# Patient Record
Sex: Male | Born: 1970 | Race: White | Hispanic: No | Marital: Single | Smoking: Former smoker
Health system: Southern US, Community
[De-identification: ages and names within clinical notes are randomized; demographics above are authoritative.]

## PROBLEM LIST (undated history)

## (undated) DIAGNOSIS — I214 Non-ST elevation (NSTEMI) myocardial infarction: Secondary | ICD-10-CM

## (undated) DIAGNOSIS — E119 Type 2 diabetes mellitus without complications: Secondary | ICD-10-CM

## (undated) DIAGNOSIS — K219 Gastro-esophageal reflux disease without esophagitis: Secondary | ICD-10-CM

## (undated) DIAGNOSIS — I213 ST elevation (STEMI) myocardial infarction of unspecified site: Secondary | ICD-10-CM

## (undated) DIAGNOSIS — I2510 Atherosclerotic heart disease of native coronary artery without angina pectoris: Secondary | ICD-10-CM

## (undated) DIAGNOSIS — I2699 Other pulmonary embolism without acute cor pulmonale: Secondary | ICD-10-CM

## (undated) DIAGNOSIS — E785 Hyperlipidemia, unspecified: Secondary | ICD-10-CM

## (undated) DIAGNOSIS — I10 Essential (primary) hypertension: Secondary | ICD-10-CM

## (undated) DIAGNOSIS — I219 Acute myocardial infarction, unspecified: Secondary | ICD-10-CM

## (undated) HISTORY — DX: Hyperlipidemia, unspecified: E78.5

## (undated) HISTORY — DX: Type 2 diabetes mellitus without complications: E11.9

## (undated) HISTORY — DX: Gastro-esophageal reflux disease without esophagitis: K21.9

## (undated) HISTORY — DX: Atherosclerotic heart disease of native coronary artery without angina pectoris: I25.10

## (undated) HISTORY — DX: ST elevation (STEMI) myocardial infarction of unspecified site: I21.3

## (undated) HISTORY — DX: Other pulmonary embolism without acute cor pulmonale: I26.99

## (undated) HISTORY — DX: Acute myocardial infarction, unspecified: I21.9

## (undated) HISTORY — DX: Non-ST elevation (NSTEMI) myocardial infarction: I21.4

## (undated) HISTORY — DX: Essential (primary) hypertension: I10

---

## 2013-05-19 DIAGNOSIS — I10 Essential (primary) hypertension: Secondary | ICD-10-CM | POA: Insufficient documentation

## 2014-05-20 ENCOUNTER — Ambulatory Visit: Payer: Self-pay | Admitting: Orthopedic Surgery

## 2014-06-20 ENCOUNTER — Ambulatory Visit: Payer: Self-pay | Admitting: Anesthesiology

## 2014-06-20 DIAGNOSIS — Z01810 Encounter for preprocedural cardiovascular examination: Secondary | ICD-10-CM

## 2014-06-20 DIAGNOSIS — I10 Essential (primary) hypertension: Secondary | ICD-10-CM

## 2014-06-20 LAB — POTASSIUM: Potassium: 3.8 mmol/L (ref 3.5–5.1)

## 2014-06-21 ENCOUNTER — Ambulatory Visit: Payer: Self-pay | Admitting: Orthopedic Surgery

## 2014-06-27 LAB — BASIC METABOLIC PANEL
Anion Gap: 6 — ABNORMAL LOW (ref 7–16)
BUN: 16 mg/dL (ref 7–18)
CALCIUM: 8.8 mg/dL (ref 8.5–10.1)
CHLORIDE: 98 mmol/L (ref 98–107)
Co2: 31 mmol/L (ref 21–32)
Creatinine: 1.05 mg/dL (ref 0.60–1.30)
Glucose: 97 mg/dL (ref 65–99)
Osmolality: 271 (ref 275–301)
POTASSIUM: 3.7 mmol/L (ref 3.5–5.1)
Sodium: 135 mmol/L — ABNORMAL LOW (ref 136–145)

## 2014-06-27 LAB — CK TOTAL AND CKMB (NOT AT ARMC)
CK, TOTAL: 149 U/L
CK-MB: 1.2 ng/mL (ref 0.5–3.6)

## 2014-06-27 LAB — CBC
HCT: 49.3 % (ref 40.0–52.0)
HGB: 16.7 g/dL (ref 13.0–18.0)
MCH: 31.4 pg (ref 26.0–34.0)
MCHC: 33.7 g/dL (ref 32.0–36.0)
MCV: 93 fL (ref 80–100)
Platelet: 328 10*3/uL (ref 150–440)
RBC: 5.31 10*6/uL (ref 4.40–5.90)
RDW: 12.9 % (ref 11.5–14.5)
WBC: 13.5 10*3/uL — AB (ref 3.8–10.6)

## 2014-06-27 LAB — PROTIME-INR
INR: 1
Prothrombin Time: 13 secs (ref 11.5–14.7)

## 2014-06-27 LAB — TROPONIN I: Troponin-I: <0.02 ng/mL

## 2014-06-27 LAB — APTT: ACTIVATED PTT: 35.7 s (ref 23.6–35.9)

## 2014-06-28 ENCOUNTER — Observation Stay: Payer: Self-pay | Admitting: Internal Medicine

## 2014-06-28 LAB — CBC WITH DIFFERENTIAL/PLATELET
BASOS ABS: 0.1 10*3/uL (ref 0.0–0.1)
BASOS PCT: 0.8 %
EOS ABS: 0.4 10*3/uL (ref 0.0–0.7)
Eosinophil %: 2.9 %
HCT: 47 % (ref 40.0–52.0)
HGB: 16.3 g/dL (ref 13.0–18.0)
Lymphocyte #: 2.3 10*3/uL (ref 1.0–3.6)
Lymphocyte %: 18.7 %
MCH: 31.9 pg (ref 26.0–34.0)
MCHC: 34.7 g/dL (ref 32.0–36.0)
MCV: 92 fL (ref 80–100)
MONOS PCT: 11.8 %
Monocyte #: 1.5 x10 3/mm — ABNORMAL HIGH (ref 0.2–1.0)
Neutrophil #: 8.1 10*3/uL — ABNORMAL HIGH (ref 1.4–6.5)
Neutrophil %: 65.8 %
Platelet: 296 10*3/uL (ref 150–440)
RBC: 5.1 10*6/uL (ref 4.40–5.90)
RDW: 13 % (ref 11.5–14.5)
WBC: 12.3 10*3/uL — AB (ref 3.8–10.6)

## 2014-06-28 LAB — HEPARIN LEVEL (UNFRACTIONATED): Anti-Xa(Unfractionated): 0.48 IU/mL (ref 0.30–0.70)

## 2014-06-29 DIAGNOSIS — Z720 Tobacco use: Secondary | ICD-10-CM | POA: Insufficient documentation

## 2014-07-03 LAB — CULTURE, BLOOD (SINGLE)

## 2014-11-15 DIAGNOSIS — E669 Obesity, unspecified: Secondary | ICD-10-CM | POA: Insufficient documentation

## 2015-01-28 NOTE — H&P (Signed)
PATIENT NAME:  Eugene Woods, ELLERS MR#:  [id number] DATE OF BIRTH:  Jun 19, 1971  DATE OF ADMISSION:  06/28/2014  REFERRING PHYSICIAN: Cecille Amsterdam. Mayford Knife, MD     PRIMARY CARE DOCTOR: Nonlocal.   ADMIT DIAGNOSIS: Pulmonary embolism.   HISTORY OF PRESENT ILLNESS: This is a 44 year old Caucasian male who presents to the Emergency Department with pain with deep inspiration and shortness of breath. The patient underwent rotator cuff surgery 6 days ago and has been feeling relatively well since then. He was not prescribed blood thinners as is usually the case with upper extremity surgery and had a followup appointment with his orthopedic surgeon 3 days ago. He was feeling fine at the time but today has developed severe pleuritic chest pain 10/10 in severity. Now that the patient has had some oral Percocet, his pain has decreased to 6/10. He does feel short of breath and is reluctant to take deep inspirations due to pain. CT angiogram of the chest showed PE in the right lung as well as probable extension of clot into the right internal jugular vein which prompted the Emergency Department to call for admission.   REVIEW OF SYSTEMS:   CONSTITUTIONAL: The patient denies fever or weakness.  EYES: Denies double vision or inflammation.  EARS, NOSE AND THROAT: Denies tinnitus or difficulty swallowing.  RESPIRATORY: Admits to shortness of breath and pain with deep inspiration.  CARDIOVASCULAR: The patient admits to chest pain as stated above as well as some dyspnea on exertion. He denies palpitations.  GASTROINTESTINAL: Denies nausea, vomiting, diarrhea or abdominal pain.  GENITOURINARY: Denies dysuria, increased frequency or hesitancy.  ENDOCRINE: The patient denies polyuria or polydipsia.  HEMATOLOGIC AND LYMPHATIC: Denies easy bruising or bleeding.  INTEGUMENT: Denies rashes or lesions.  MUSCULOSKELETAL: Denies any new myalgias or arthralgias.  NEUROLOGIC: The patient denies numbness in his extremities or  dysarthria.  PSYCHIATRIC: The patient denies depression or suicidal ideation.   PAST MEDICAL HISTORY: Hypertension.   PAST SURGICAL HISTORY: Tonsillectomy and adenoidectomy as well as facial reconstruction surgery following a traumatic accident.   FAMILY HISTORY: Significant for coronary artery disease.   SOCIAL HISTORY: The patient has 8 children all of whom are in good health. He has a 14 pack-year smoking history. He is a social drinker, but he denies any illicit drug use.   HOME MEDICATIONS:  1.  Lisinopril with hydrochlorothiazide 20 mg -- 12.5 mg 1 tab p.o. daily.  2.  Percocet 2.5 -- 325 mg 1 tab p.o. every 6 hours as needed for pain.    ALLERGIES: No known drug allergies.   PERTINENT LABORATORY RESULTS AND RADIOGRAPHIC FINDINGS: BUN is 16, creatinine is 1.05, sodium is 135, potassium is 3.7, troponin is negative. White blood cell count is 13.5, hemoglobin 16.7 and hematocrit is 49.3. INR is 1. Chest x-ray shows mild bibasilar atelectatic change and CT angiogram shows segmental and subsegmental pulmonary emboli on the right with probable thrombus in the right lower internal jugular vein.   PHYSICAL EXAMINATION:   VITAL SIGNS: Temperature is 99.2, pulse was originally 120, now 99, respirations 18, blood pressure 135/88, pulse oximetry 98% on 2 liters of oxygen via nasal cannula.  GENERAL: The patient is alert and oriented x3 in no apparent distress.  HEENT: Normocephalic, atraumatic. Pupils equal, round, and reactive to light and accommodation. Extraocular movements are intact. Mucous membranes are moist.  NECK: Trachea is midline. No adenopathy or no evidence of phlebitis.  CHEST: Symmetric and atraumatic.  CARDIOVASCULAR: Tachycardic with normal S1, S2. No rubs,  clicks or murmurs appreciated.  LUNGS: Clear to auscultation bilaterally. Normal effort and excursion but aversion to deep inspiration due to pain.  ABDOMEN: Positive bowel sounds. Soft, nontender, nondistended. No  hepatosplenomegaly.  GENITOURINARY: Deferred.  MUSCULOSKELETAL: The patient moves all four extremities equally, has 5/5 strength in upper and lower extremities bilaterally.  SKIN: No rashes or lesions but the right side of the patient's chest is flushed and his face is mildly plethoric. He has no appearance of septic emboli.  EXTREMITIES: No clubbing, cyanosis or edema.  NEUROLOGIC: Cranial nerves II through XII are grossly intact.  PSYCHIATRIC: Mood is normal. Affect is congruent.   ASSESSMENT AND PLAN: This is a 44 year old gentleman with right side pulmonary emboli.  1.  Pulmonary embolism. I have started the patient on a heparin drip. His CTA shows probable clot extension to the right IJ. The patient mentioned that he recently had a dental abscess that was not treated with prescribed antibiotics (he did take some leftover Augmentin from a friend however). I have drawn blood cultures and I have a low threshold for considering Lemiere's syndrome in this patient given his plethoric appearance of his face, recent shoulder surgery and this dental abscess that he mentioned. I have started him on ceftriaxone and vancomycin. If his white blood cell count does not increase or if the differential on the following blood draw does not indicate bacterial cause, we may be able to stop those antibiotics especially if the cultures do not grow after 48 hours.  2.  Hypertension. We can restart the patient's home dose of lisinopril with hydrochlorothiazide as he is not hypotensive.  3.  Systemic inflammatory response syndrome. The patient does not look toxic or bacteremic at this time. He does meet criteria for SIRS due to his tachycardia and tachypnea but of course this can be present with pulmonary embolism alone. If he displays signs or symptoms of sepsis, we will continue his  antibiotics.  4.  Deep vein thrombosis prophylaxis. The patient is on full dose of heparin for known pulmonary embolism.  5.   Gastrointestinal prophylaxis. The patient is not critically ill and thus this is not necessary at this time.  6.  The patient is a full code.   TIME SPENT ON ADMISSION ORDERS AND PATIENT CARE: Approximately 35 minutes.   ____________________________ Kelton PillarMichael S. Sheryle Hailiamond, MD msd:AT D: 06/28/2014 01:35:30 ET T: 06/28/2014 03:00:15 ET JOB#: [id number]  cc: Kelton PillarMichael S. Sheryle Hailiamond, MD, <Dictator> Kelton PillarMICHAEL S Trafton Roker MD ELECTRONICALLY SIGNED 07/02/2014 0:50

## 2015-01-28 NOTE — Op Note (Signed)
PATIENT NAME:  Eugene LangoCOOPER, [name] MR#:  [id number] DATE OF BIRTH:  1971/08/22  DATE OF PROCEDURE:  06/21/2014  PREOPERATIVE DIAGNOSIS: Right large rotator cuff tear.   POSTOPERATIVE DIAGNOSIS: Right large rotator cuff tear.   PROCEDURE: Right rotator cuff repair and subacromial decompression.   ANESTHESIA: General.   SURGEON: Leitha SchullerMichael J. Savaya Hakes, MD   DESCRIPTION OF PROCEDURE: The patient was brought to the operating room, and after adequate general anesthesia was obtained, a bump was placed underneath the right scapula and the patient placed in a semi-recumbent position. The shoulder was then prepped and draped in the usual sterile fashion with appropriate patient identification and timeout procedures carried out. After this was completed, an approximately 3 cm incision was made over the distal acromion in a saber-type incision and the deltoid was split and the subacromial space entered. There was a large amount of bursitis which was debrided with the use of rongeur. An oscillating  rasp was used to smooth the anterior acromion, which was quite thick. This gave more room to get into the subacromial space. There was significant rotator cuff tear of supraspinatus, full thickness, approximately 3 cm in width, retracted. The tendon itself, however, could be, without any special techniques, be mobilized back to its origin. There was a spur on the humeral side. This was debrided down to get to bleeding bone to allow for reattachment of the tendon to the greater tuberosity. First, 3 sutures were passed within the tendon: anterior, posterior, and middle; and then, sequentially, SpeedScrew suture anchors were placed, first going anterior, tightening the anterior suture, then posteriorly tightening this, and then going more lateral for more of a double-row technique, with the center sutures giving a very nice appearance to the closure with tendon apposition to bone, and it was stable through a range of motion. The wound  was thoroughly irrigated and the deltoid repaired using a #1 Vicryl, 2-0 Vicryl subcutaneously, and 4-0 nylon for the skin. Then, 30 mL of 0.5% Sensorcaine with epinephrine infiltrated around the incision for postoperative analgesia.   ESTIMATED BLOOD LOSS: Minimal.   COMPLICATIONS: None.   SPECIMEN: None.  IMPLANTS: ArthroCare SpeedScrew anchor x 3.   ____________________________ Leitha SchullerMichael J. Brando Taves, MD mjm:ST D: 06/21/2014 23:32:59 ET T: 06/22/2014 00:03:08 ET JOB#: [id number]  cc: Leitha SchullerMichael J. Brazen Domangue, MD, <Dictator> Leitha SchullerMICHAEL J Akira Adelsberger MD ELECTRONICALLY SIGNED 06/22/2014 15:03

## 2015-01-28 NOTE — Discharge Summary (Signed)
PATIENT NAME:  Eugene Woods, Ashdon D MR#:  [id number] DATE OF BIRTH:  [birth year]  DATE OF ADMISSION:  06/28/2014 DATE OF DISCHARGE:  06/28/2014  ADMISSION DIAGNOSIS: Pulmonary embolus.   DISCHARGE DIAGNOSES:  1.  Pulmonary embolus with possible thrombus in the right internal jugular.  2.  Recent rotator cuff repair.  3.  Hypertension.   CONSULTATIONS: None.   HOSPITAL COURSE: A 44 year old male who was admitted for a pulmonary emboli. He presented with pain with deep inspiration, had a CT scan that did show a pulmonary emboli on the right side along with possible thrombus in the IgA. For further details, please refer to the H and P.  1.  Pulmonary emboli. The patient was initially started on heparin drip. I actually called vascular surgery and spoke with Dr. Gilda CreaseSchnier about the thrombus in the right IJ. Really there are no any procedures that we can do for this right IJ as the patient is not having any acute issues. Anticoagulation is obviously recommended. I spoke with the patient in great detail about side effects, alternatives, benefits and risks, not limited to only bleeding but also a subdural hematoma, and the patient is in favor of starting anticoagulation. He prefers Eliquis so we have started Eliquis. Heparin has been stopped. There is no indication for antibiotics as was started by the admitting physician so these were discontinued. Treatment of his pulmonary embolism should be for about 3 to 6 months. This is secondary to the fact that he had immobilization of the right arm.  2.  Hypertension. The patient will continue his outpatient medications.   DISCHARGE MEDICATIONS: 1.  HCTZ/lisinopril 12.5 daily.  2.  Percocet 1 tablet q. 6 hours p.r.n.  3.  Eliquis 10 mg p.o. b.i.d. x7 days, then 5 mg p.o. b.i.d. for at least 3 to 6 months.   DISCHARGE DIET: Low sodium.  DISCHARGE ACTIVITY: As tolerated.  DISCHARGE FOLLOWUP:  The patient will follow up with Dr. Ellsworth Lennoxejan-Sie in 1 week.   The patient  is stable for discharge.  TIME 35 minutes  ____________________________ Autumn Pruitt P. Juliene PinaMody, MD spm:sb D: 06/28/2014 11:06:34 ET T: 06/28/2014 11:38:58 ET JOB#: [id number]  cc: Luccia Reinheimer P. Juliene PinaMody, MD, <Dictator> Silas FloodSheikh A. Ellsworth Lennoxejan-Sie, MD Janyth ContesSITAL P Mecca Guitron MD ELECTRONICALLY SIGNED 06/28/2014 13:09

## 2015-10-08 DIAGNOSIS — I213 ST elevation (STEMI) myocardial infarction of unspecified site: Secondary | ICD-10-CM

## 2015-10-08 HISTORY — DX: ST elevation (STEMI) myocardial infarction of unspecified site: I21.3

## 2015-11-03 ENCOUNTER — Ambulatory Visit
Admission: EM | Admit: 2015-11-03 | Discharge: 2015-11-03 | Disposition: A | Discharge status: Home / Self Care | Payer: 59

## 2015-11-03 NOTE — ED Notes (Signed)
Patient left the urgent care before being triaged.

## 2015-11-28 DIAGNOSIS — Z86711 Personal history of pulmonary embolism: Secondary | ICD-10-CM | POA: Insufficient documentation

## 2016-02-16 IMAGING — MR MRI OF THE RIGHT SHOULDER WITHOUT CONTRAST
5 series · 40 of 40 positions shown · non-contrast
Comparison: None.

CLINICAL DATA: Status post fall, shoulder pain

EXAM:
MRI OF THE RIGHT SHOULDER WITHOUT CONTRAST
TECHNIQUE: Multiplanar, multisequence MR imaging of the shoulder was performed.
No intravenous contrast was administered.

[Series 3: T2 fat-sat · axial · 4.0mm · 0.55mm/px · z∈[-31,+75]mm · 8 of 25 slices shown (1 of 2)]
[im 1/25]
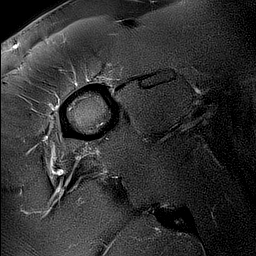
[im 4/25]
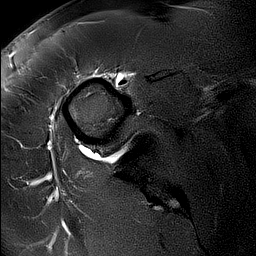
[im 7/25]
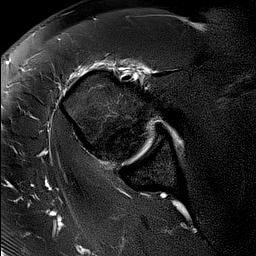
[im 11/25]
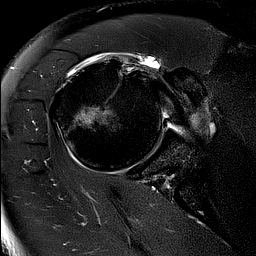
[im 14/25]
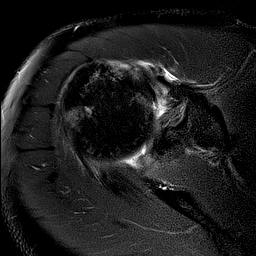
[im 18/25]
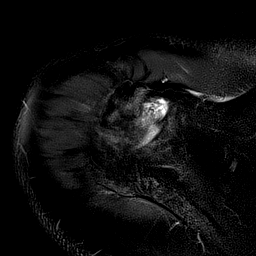
[im 21/25]
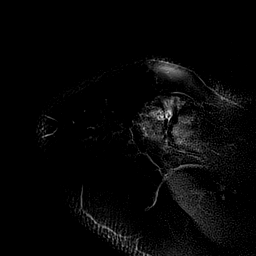
[im 25/25]
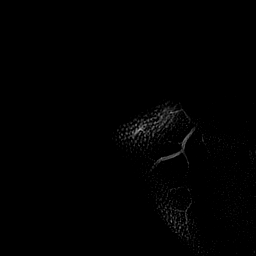

[Series 4: T2 · oblique · 4.0mm · 0.62mm/px · 8 of 23 slices shown]
[im 1/23]
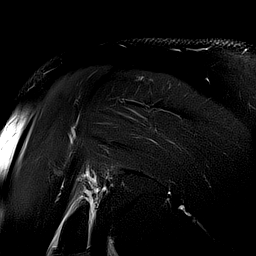
[im 4/23]
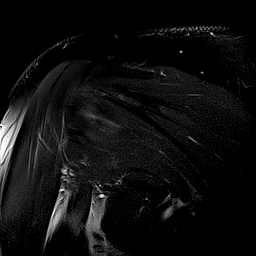
[im 7/23]
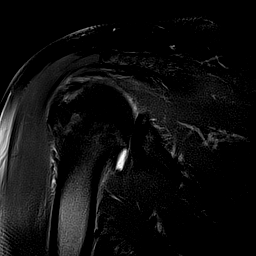
[im 10/23]
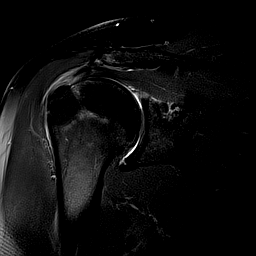
[im 13/23]
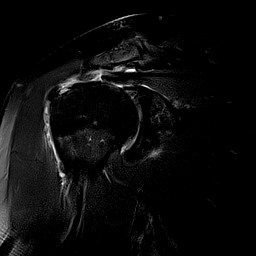
[im 16/23]
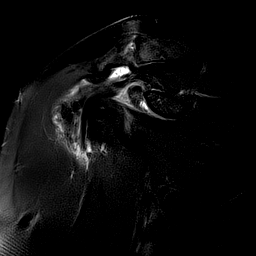
[im 19/23]
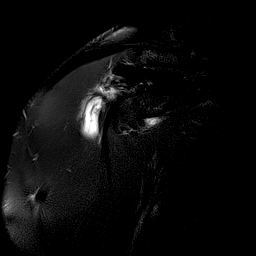
[im 23/23]
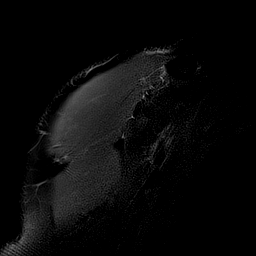

[Series 5: PD · oblique · 4.0mm · 0.62mm/px · 8 of 23 slices shown]
[im 1/23]
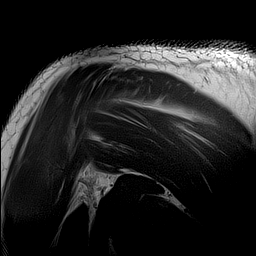
[im 4/23]
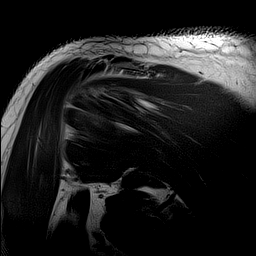
[im 7/23]
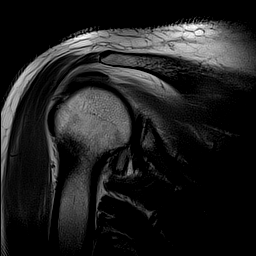
[im 10/23]
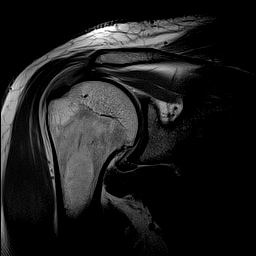
[im 13/23]
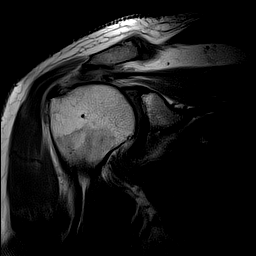
[im 16/23]
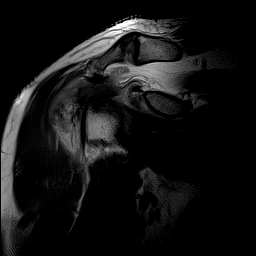
[im 19/23]
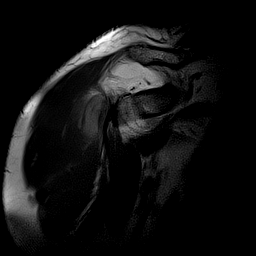
[im 23/23]
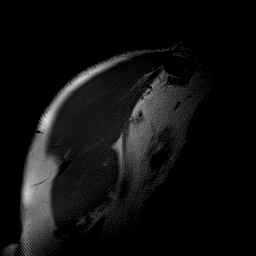

[Series 6: T1 · oblique · 4.0mm · 0.55mm/px · 8 of 24 slices shown]
[im 1/24]
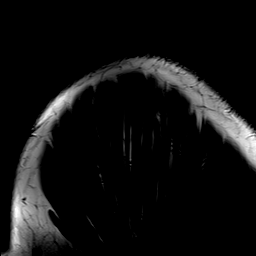
[im 4/24]
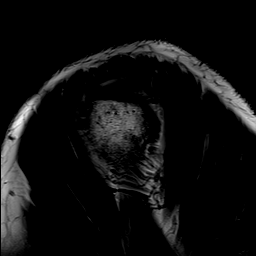
[im 7/24]
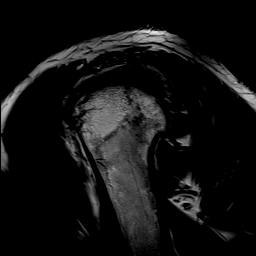
[im 10/24]
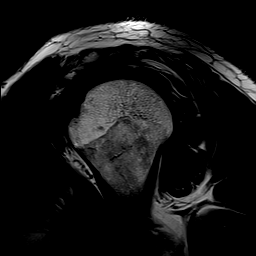
[im 14/24]
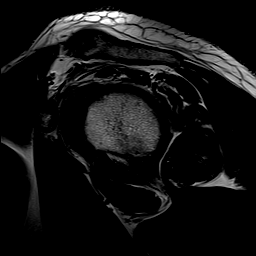
[im 17/24]
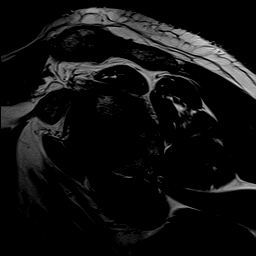
[im 20/24]
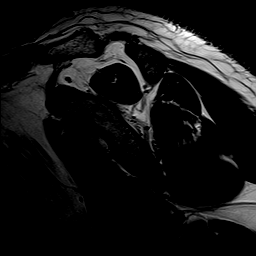
[im 24/24]
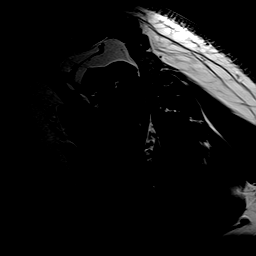

[Series 7: T2 fat-sat · oblique · 4.0mm · 0.55mm/px · 8 of 24 slices shown (2 of 2)]
[im 1/24]
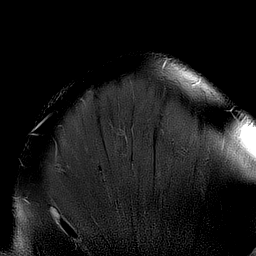
[im 4/24]
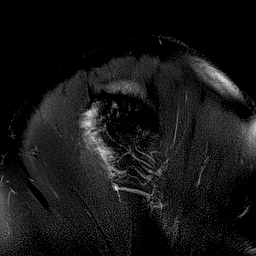
[im 7/24]
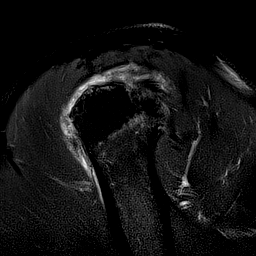
[im 10/24]
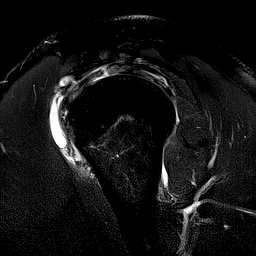
[im 14/24]
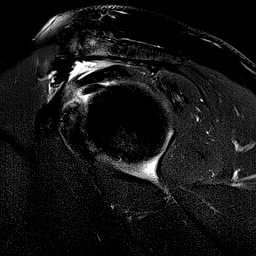
[im 17/24]
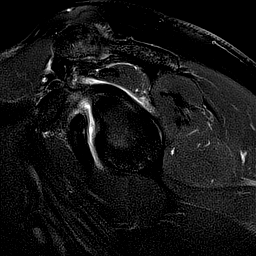
[im 20/24]
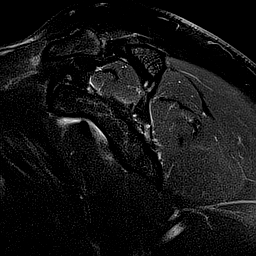
[im 24/24]
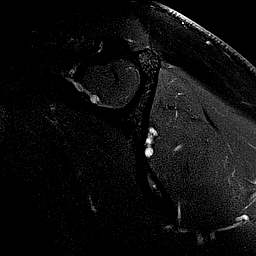

[40 of 40 positions shown; findings below may reference images not displayed]

FINDINGS: Rotator cuff: Complete tear of the supraspinatus tendon peripherally
with 2.9 cm of retraction. Small interstitial tear at the
musculotendinous junction of the infraspinatus tendon which may
extend to the bursal surface. There is no full-thickness tear of the
infraspinatus tendon. Teres minor tendon is intact. Moderate
tendinosis of the subscapularis tendon with a high-grade partial
tear of the superior distal fibers.

Muscles: No atrophy or fatty replacement of nor abnormal signal
within, the muscles of the rotator cuff.

Biceps long head:  Intact.

Acromioclavicular Joint: Moderate degenerative changes of the
acromioclavicular joint. Type I acromion. Small amount of
subacromial/ subdeltoid bursal fluid likely secondary to the
presence of a full thickness rotator cuff tear.

Glenohumeral Joint: No significant joint effusion. No chondral
defect. No dislocation.

Labrum: Grossly intact, but evaluation is limited by lack of
intraarticular fluid.

Bones:  No marrow signal abnormality.
IMPRESSION: 1. Complete tear of the supraspinatus tendon peripherally with
cm of retraction.
2. Small interstitial tear at the musculotendinous junction of the
infraspinatus tendon which may extend to the bursal surface.
Full-thickness tear of the infraspinatus tendon.
3. Moderate tendinosis of the subscapularis tendon with a high-grade
partial tear of the superior distal fibers.

## 2016-03-07 HISTORY — PX: CORONARY ANGIOPLASTY WITH STENT PLACEMENT: SHX49

## 2016-03-08 DIAGNOSIS — Z8674 Personal history of sudden cardiac arrest: Secondary | ICD-10-CM | POA: Insufficient documentation

## 2016-03-08 DIAGNOSIS — Z955 Presence of coronary angioplasty implant and graft: Secondary | ICD-10-CM | POA: Insufficient documentation

## 2016-03-22 DIAGNOSIS — I2510 Atherosclerotic heart disease of native coronary artery without angina pectoris: Secondary | ICD-10-CM | POA: Insufficient documentation

## 2016-04-06 HISTORY — PX: CORONARY ANGIOPLASTY WITH STENT PLACEMENT: SHX49

## 2016-05-16 DIAGNOSIS — G4733 Obstructive sleep apnea (adult) (pediatric): Secondary | ICD-10-CM | POA: Insufficient documentation

## 2016-05-30 ENCOUNTER — Encounter: Payer: 59 | Attending: Family Medicine | Admitting: *Deleted

## 2016-05-30 VITALS — Ht 72.0 in | Wt 310.3 lb

## 2016-05-30 DIAGNOSIS — I213 ST elevation (STEMI) myocardial infarction of unspecified site: Secondary | ICD-10-CM | POA: Diagnosis not present

## 2016-05-30 NOTE — Progress Notes (Signed)
Cardiac Individual Treatment Plan  Patient Details  Name: Eugene Woods MRN: [id number] Date of Birth: 12-Feb-1971 Referring Provider:   Flowsheet Row Cardiac Rehab from 05/30/2016 in [facility] Cardiac and Pulmonary Rehab  Referring Provider  [name]      Initial Encounter Date:  Flowsheet Row Cardiac Rehab from 05/30/2016 in [facility] Cardiac and Pulmonary Rehab  Date  05/30/16  Referring Provider  [name]      Visit Diagnosis: ST elevation myocardial infarction (STEMI), unspecified artery (HCC)  Patient's Home Medications on Admission:  Current Outpatient Prescriptions:  .  aspirin 81 MG chewable tablet, Chew 81 mg by mouth., Disp: , Rfl:  .  atorvastatin (LIPITOR) 80 MG tablet, Take 80 mg by mouth., Disp: , Rfl:  .  furosemide (LASIX) 20 MG tablet, Take 20 mg by mouth., Disp: , Rfl:  .  isosorbide mononitrate (IMDUR) 60 MG 24 hr tablet, Take 60 mg by mouth., Disp: , Rfl:  .  lisinopril (PRINIVIL,ZESTRIL) 20 MG tablet, Take 20 mg by mouth., Disp: , Rfl:  .  metoprolol succinate (TOPROL-XL) 25 MG 24 hr tablet, Take 25 mg by mouth., Disp: , Rfl:  .  nitroGLYCERIN (NITROSTAT) 0.4 MG SL tablet, Place 0.4 mg under the tongue., Disp: , Rfl:  .  prasugrel (EFFIENT) 10 MG TABS tablet, Take 10 mg by mouth., Disp: , Rfl:   Past Medical History: No past medical history on file.  Tobacco Use: History  Smoking Status  . Not on file  Smokeless Tobacco  . Not on file    Labs: Recent Review Flowsheet Data    There is no flowsheet data to display.       Exercise Target Goals: Date: 05/30/16  Exercise Program Goal: Individual exercise prescription set with THRR, safety & activity barriers. Participant demonstrates ability to understand and report RPE using BORG scale, to self-measure pulse accurately, and to acknowledge the importance of the exercise prescription.  Exercise Prescription Goal: Starting with aerobic activity 30 plus minutes a day, 3 days per week for initial exercise  prescription. Provide home exercise prescription and guidelines that participant acknowledges understanding prior to discharge.  Activity Barriers & Risk Stratification:     Activity Barriers & Cardiac Risk Stratification - 05/30/16 1506      Activity Barriers & Cardiac Risk Stratification   Activity Barriers Joint Problems  History of right shoulder surgery   Cardiac Risk Stratification High      6 Minute Walk:     6 Minute Walk    Row Name 05/30/16 1454         6 Minute Walk   Distance 1310 feet     Walk Time 6 minutes     MPH 2.48     METS 3.23     RPE 13     VO2 Peak 11.3     Symptoms No     Resting HR 61 bpm     Resting BP 106/62     Max Ex. HR 94 bpm     Max Ex. BP 126/68        Initial Exercise Prescription:     Initial Exercise Prescription - 05/30/16 1400      Date of Initial Exercise RX and Referring Provider   Date 05/30/16   Referring Provider [name]     Treadmill   MPH 2.5   Grade 0   Minutes 15   METs 2.91     Recumbant Bike   Level 2   Minutes 15  METs 2.9     REL-XR   Level 3   Minutes 15   METs 2.9     T5 Nustep   Level 3   Minutes 15   METs 2.9     Prescription Details   Frequency (times per week) 2   Duration Progress to 45 minutes of aerobic exercise without signs/symptoms of physical distress     Intensity   THRR 40-80% of Max Heartrate 106-151   Ratings of Perceived Exertion 11-13     Progression   Progression Continue to progress workloads to maintain intensity without signs/symptoms of physical distress.     Resistance Training   Training Prescription Yes   Weight 3   Reps 10-15      Perform Capillary Blood Glucose checks as needed.  Exercise Prescription Changes:     Exercise Prescription Changes    Row Name 05/30/16 1400             Response to Exercise   Blood Pressure (Admit) 106/62       Blood Pressure (Exercise) 126/68       Blood Pressure (Exit) 102/32       Heart Rate (Admit) 72 bpm        Heart Rate (Exercise) 94 bpm       Heart Rate (Exit) 71 bpm       Rating of Perceived Exertion (Exercise) 13          Exercise Comments:   Discharge Exercise Prescription (Final Exercise Prescription Changes):     Exercise Prescription Changes - 05/30/16 1400      Response to Exercise   Blood Pressure (Admit) 106/62   Blood Pressure (Exercise) 126/68   Blood Pressure (Exit) 102/32   Heart Rate (Admit) 72 bpm   Heart Rate (Exercise) 94 bpm   Heart Rate (Exit) 71 bpm   Rating of Perceived Exertion (Exercise) 13      Nutrition:  Target Goals: Understanding of nutrition guidelines, daily intake of sodium 1500mg , cholesterol 200mg , calories 30% from fat and 7% or less from saturated fats, daily to have 5 or more servings of fruits and vegetables.  Biometrics:     Pre Biometrics - 05/30/16 1453      Pre Biometrics   Height 6' (1.829 m)   Weight (!)  310 lb 4.8 oz (140.8 kg)   Waist Circumference 51.75 inches   Hip Circumference 53.25 inches   Waist to Hip Ratio 0.97 %   BMI (Calculated) 42.2   Single Leg Stand 30 seconds       Nutrition Therapy Plan and Nutrition Goals:     Nutrition Therapy & Goals - 05/30/16 1504      Intervention Plan   Intervention Prescribe, educate and counsel regarding individualized specific dietary modifications aiming towards targeted core components such as weight, hypertension, lipid management, diabetes, heart failure and other comorbidities.;Nutrition handout(s) given to patient.      Nutrition Discharge: Rate Your Plate Scores:     Nutrition Assessments - 05/30/16 1506      Rate Your Plate Scores   Pre Score 77   Pre Score % 85 %      Nutrition Goals Re-Evaluation:   Psychosocial: Target Goals: Acknowledge presence or absence of depression, maximize coping skills, provide positive support system. Participant is able to verbalize types and ability to use techniques and skills needed for reducing stress and  depression.  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 Does report a supportive boss.      Family Dynamics   Good Support System? Yes  Mom and Dad supportive, live in [place], [place]   Comments Single parent of 7 children. Ages 61-26 4 at home.     Barriers   Psychosocial barriers to participate in program There are no identifiable barriers or psychosocial needs.;The patient should benefit from training in stress management and relaxation.     Screening Interventions   Interventions Encouraged to exercise      Quality of Life Scores:     Quality of Life - 05/30/16 1759      Quality of Life Scores   Health/Function Pre 20 %   Socioeconomic Pre 20.36 %   Psych/Spiritual Pre 20.36 %   Family Pre 26 %   GLOBAL Pre 20.91 %      PHQ-9: Recent Review Flowsheet Data    Depression screen [facility] 2/9 05/30/2016   Decreased Interest 2   Down, Depressed, Hopeless 0   PHQ - 2 Score 2   Altered sleeping 1   Tired, decreased energy 2   Change in appetite 0   Feeling bad or failure about yourself  0   Trouble concentrating 0   Moving slowly or fidgety/restless 0   Suicidal thoughts 0   PHQ-9 Score 5   Difficult doing work/chores Somewhat difficult       Psychosocial Evaluation and Intervention:   Psychosocial Re-Evaluation:   Vocational Rehabilitation: Provide vocational rehab assistance to qualifying candidates.   Vocational Rehab Evaluation & Intervention:     Vocational Rehab - 05/30/16 1507      Initial Vocational Rehab Evaluation & Intervention   Assessment shows need for Vocational Rehabilitation No      Education: Education Goals: Education classes will be provided on a weekly basis, covering required topics.  Participant will state understanding/return demonstration of topics presented.  Learning Barriers/Preferences:     Learning Barriers/Preferences - 05/30/16 1507      Learning Barriers/Preferences   Learning Barriers None   Learning Preferences None      Education Topics: General Nutrition Guidelines/Fats and Fiber: -Group instruction provided by verbal, written material, models and posters to present the general guidelines for heart healthy nutrition. Gives an explanation and review of dietary fats and fiber.   Controlling Sodium/Reading Food Labels: -Group verbal and written material supporting the discussion of sodium use in heart healthy nutrition. Review and explanation with models, verbal and written materials for utilization of the food label.   Exercise Physiology & Risk Factors: - Group verbal and written instruction with models to review the exercise physiology of the cardiovascular system and associated critical values. Details cardiovascular disease risk factors and the goals associated with each risk factor.   Aerobic Exercise & Resistance Training: - Gives group verbal and written discussion on the health impact of inactivity. On the components of aerobic and resistive training programs and the benefits of this training and how to safely progress through these programs.   Flexibility, Balance, General Exercise Guidelines: - Provides group verbal and written instruction on the benefits of flexibility and balance training programs. Provides general exercise guidelines with specific guidelines to those with heart or lung disease. Demonstration and skill practice provided.   Stress Management: - Provides  group verbal and written instruction about the health risks of elevated stress, cause of high stress, and healthy ways to reduce stress.   Depression: - Provides group verbal and written instruction on the correlation between heart/lung disease and depressed mood,  treatment options, and the stigmas associated with seeking treatment.   Anatomy & Physiology of the Heart: - Group verbal and written instruction and models provide basic cardiac anatomy and physiology, with the coronary electrical and arterial systems. Review of: AMI, Angina, Valve disease, Heart Failure, Cardiac Arrhythmia, Pacemakers, and the ICD.   Cardiac Procedures: - Group verbal and written instruction and models to describe the testing methods done to diagnose heart disease. Reviews the outcomes of the test results. Describes the treatment choices: Medical Management, Angioplasty, or Coronary Bypass Surgery.   Cardiac Medications: - Group verbal and written instruction to review commonly prescribed medications for heart disease. Reviews the medication, class of the drug, and side effects. Includes the steps to properly store meds and maintain the prescription regimen.   Go Sex-Intimacy & Heart Disease, Get SMART - Goal Setting: - Group verbal and written instruction through game format to discuss heart disease and the return to sexual intimacy. Provides group verbal and written material to discuss and apply goal setting through the application of the S.M.A.R.T. Method.   Other Matters of the Heart: - Provides group verbal, written materials and models to describe Heart Failure, Angina, Valve Disease, and Diabetes in the realm of heart disease. Includes description of the disease process and treatment options available to the cardiac patient.   Exercise & Equipment Safety: - Individual verbal instruction and demonstration of equipment use and safety with use of the equipment. Flowsheet Row [facility]  Date  05/30/16  Educator  SB  Instruction Review Code  2- meets goals/outcomes      Infection Prevention: - Provides verbal and written material to individual with discussion of infection control including proper hand washing  and proper equipment cleaning during exercise session. Flow[facility]  Date  05/30/16  Educator  SB  Instruction Review Code  2- meets goals/outcomes      Falls Prevention: - Provides verbal and written material to individual with discussion of falls prevention and safety.   Diabetes: - Individual verbal and written instruction to review signs/symptoms of diabetes, desired ranges of glucose level fasting, after meals and with exercise. Advice that pre and post exercise glucose checks will be done for 3 sessions at entry of program.    Knowledge Questionnaire Score:     Knowledge Questionnaire Score - 05/30/16 1507      Knowledge Questionnaire Score   Pre Score 24/28      Core Components/Risk Factors/Patient Goals at Admission:     Personal Goals and Risk Factors at Admission - 05/30/16 1300      Core Components/Risk Factors/Patient Goals on Admission    Weight Management Yes;Obesity   Intervention Weight Management: Develop a combined nutrition and exercise program designed to reach desired caloric intake, while maintaining appropriate intake of nutrient and fiber, sodium and fats, and appropriate energy expenditure required for the weight goal.;Weight Management: Provide education and appropriate resources to help participant work on and attain dietary goals.   Admit Weight 308 lb (139.7 kg)   Goal Weight: Short Term 299 lb (135.6 kg)   Goal Weight: Long Term 220 lb (99.8 kg)   Expected Outcomes Weight  Loss: Understanding of general recommendations for a balanced deficit meal plan, which promotes 1-2 lb weight loss per week and includes a negative energy balance of [phone] kcal/d   Sedentary Yes   Intervention Provide advice, education, support and counseling about physical activity/exercise needs.;Develop an individualized exercise prescription for aerobic and resistive training based on initial evaluation  findings, risk stratification, comorbidities and participant's personal goals.   Expected Outcomes Achievement of increased cardiorespiratory fitness and enhanced flexibility, muscular endurance and strength shown through measurements of functional capacity and personal statement of participant.   Increase Strength and Stamina Yes   Intervention Provide advice, education, support and counseling about physical activity/exercise needs.;Develop an individualized exercise prescription for aerobic and resistive training based on initial evaluation findings, risk stratification, comorbidities and participant's personal goals.   Expected Outcomes Achievement of increased cardiorespiratory fitness and enhanced flexibility, muscular endurance and strength shown through measurements of functional capacity and personal statement of participant.   Hypertension Yes   Intervention Provide education on lifestyle modifcations including regular physical activity/exercise, weight management, moderate sodium restriction and increased consumption of fresh fruit, vegetables, and low fat dairy, alcohol moderation, and smoking cessation.;Monitor prescription use compliance.   Expected Outcomes Short Term: Continued assessment and intervention until BP is < 140/6690mm HG in hypertensive participants. < 130/6880mm HG in hypertensive participants with diabetes, heart failure or chronic kidney disease.;Long Term: Maintenance of blood pressure at goal levels.   Lipids Yes   Intervention Provide education and support for participant on nutrition & aerobic/resistive exercise along with prescribed medications to achieve LDL 70mg , HDL >40mg .   Expected Outcomes Short Term: Participant states understanding of desired cholesterol values and is compliant with medications prescribed. Participant is following exercise prescription and nutrition guidelines.;Long Term: Cholesterol controlled with medications as prescribed, with individualized  exercise RX and with personalized nutrition plan. Value goals: LDL < 70mg , HDL > 40 mg.   Stress Yes   Intervention Offer individual and/or small group education and counseling on adjustment to heart disease, stress management and health-related lifestyle change. Teach and support self-help strategies.;Refer participants experiencing significant psychosocial distress to appropriate mental health specialists for further evaluation and treatment. When possible, include family members and significant others in education/counseling sessions.   Expected Outcomes Short Term: Participant demonstrates changes in health-related behavior, relaxation and other stress management skills, ability to obtain effective social support, and compliance with psychotropic medications if prescribed.;Long Term: Emotional wellbeing is indicated by absence of clinically significant psychosocial distress or social isolation.      Core Components/Risk Factors/Patient Goals Review:    Core Components/Risk Factors/Patient Goals at Discharge (Final Review):    ITP Comments:     ITP Comments    Row Name 05/30/16 1514           ITP Comments Initial ITP created today during medical review.  Documentation of admitting diagnosis can be found in [facility]          Comments:

## 2016-05-30 NOTE — Patient Instructions (Signed)
Patient Instructions  Patient Details  Name: Eugene Woods MRN: [id number] Date of Birth: [birth year] Referring Provider:  Lauree Chandlerayson, Robert, MD  Below are the personal goals you chose as well as exercise and nutrition goals. Our goal is to help you keep on track towards obtaining and maintaining your goals. We will be discussing your progress on these goals with you throughout the program.  Initial Exercise Prescription:     Initial Exercise Prescription - 05/30/16 1400      Date of Initial Exercise RX and Referring Provider   Date 05/30/16   Referring Provider [name]     Treadmill   MPH 2.5   Grade 0   Minutes 15   METs 2.91     Recumbant Bike   Level 2   Minutes 15   METs 2.9     REL-XR   Level 3   Minutes 15   METs 2.9     T5 Nustep   Level 3   Minutes 15   METs 2.9     Prescription Details   Frequency (times per week) 2   Duration Progress to 45 minutes of aerobic exercise without signs/symptoms of physical distress     Intensity   THRR 40-80% of Max Heartrate 106-151   Ratings of Perceived Exertion 11-13     Progression   Progression Continue to progress workloads to maintain intensity without signs/symptoms of physical distress.     Resistance Training   Training Prescription Yes   Weight 3   Reps 10-15      Exercise Goals: Frequency: Be able to perform aerobic exercise three times per week working toward 3-5 days per week.  Intensity: Work with a perceived exertion of 11 (fairly light) - 15 (hard) as tolerated. Follow your new exercise prescription and watch for changes in prescription as you progress with the program. Changes will be reviewed with you when they are made.  Duration: You should be able to do 30 minutes of continuous aerobic exercise in addition to a 5 minute warm-up and a 5 minute cool-down routine.  Nutrition Goals: Your personal nutrition goals will be established when you do your nutrition analysis with the dietician.  The  following are nutrition guidelines to follow: Cholesterol < 200mg /day Sodium < 1500mg /day Fiber: Men under 50 yrs - 38 grams per day  Personal Goals:     Personal Goals and Risk Factors at Admission - 05/30/16 1300      Core Components/Risk Factors/Patient Goals on Admission    Weight Management Yes;Obesity   Intervention Weight Management: Develop a combined nutrition and exercise program designed to reach desired caloric intake, while maintaining appropriate intake of nutrient and fiber, sodium and fats, and appropriate energy expenditure required for the weight goal.;Weight Management: Provide education and appropriate resources to help participant work on and attain dietary goals.   Admit Weight 308 lb (139.7 kg)   Goal Weight: Short Term 299 lb (135.6 kg)   Goal Weight: Long Term 220 lb (99.8 kg)   Expected Outcomes Weight Loss: Understanding of general recommendations for a balanced deficit meal plan, which promotes 1-2 lb weight loss per week and includes a negative energy balance of [phone] kcal/d   Sedentary Yes   Intervention Provide advice, education, support and counseling about physical activity/exercise needs.;Develop an individualized exercise prescription for aerobic and resistive training based on initial evaluation findings, risk stratification, comorbidities and participant's personal goals.   Expected Outcomes Achievement of increased cardiorespiratory fitness and enhanced flexibility, muscular  endurance and strength shown through measurements of functional capacity and personal statement of participant.   Increase Strength and Stamina Yes   Intervention Provide advice, education, support and counseling about physical activity/exercise needs.;Develop an individualized exercise prescription for aerobic and resistive training based on initial evaluation findings, risk stratification, comorbidities and participant's personal goals.   Expected Outcomes Achievement of increased  cardiorespiratory fitness and enhanced flexibility, muscular endurance and strength shown through measurements of functional capacity and personal statement of participant.   Hypertension Yes   Intervention Provide education on lifestyle modifcations including regular physical activity/exercise, weight management, moderate sodium restriction and increased consumption of fresh fruit, vegetables, and low fat dairy, alcohol moderation, and smoking cessation.;Monitor prescription use compliance.   Expected Outcomes Short Term: Continued assessment and intervention until BP is < 140/5590mm HG in hypertensive participants. < 130/1580mm HG in hypertensive participants with diabetes, heart failure or chronic kidney disease.;Long Term: Maintenance of blood pressure at goal levels.   Lipids Yes   Intervention Provide education and support for participant on nutrition & aerobic/resistive exercise along with prescribed medications to achieve LDL 70mg , HDL >40mg .   Expected Outcomes Short Term: Participant states understanding of desired cholesterol values and is compliant with medications prescribed. Participant is following exercise prescription and nutrition guidelines.;Long Term: Cholesterol controlled with medications as prescribed, with individualized exercise RX and with personalized nutrition plan. Value goals: LDL < 70mg , HDL > 40 mg.   Stress Yes   Intervention Offer individual and/or small group education and counseling on adjustment to heart disease, stress management and health-related lifestyle change. Teach and support self-help strategies.;Refer participants experiencing significant psychosocial distress to appropriate mental health specialists for further evaluation and treatment. When possible, include family members and significant others in education/counseling sessions.   Expected Outcomes Short Term: Participant demonstrates changes in health-related behavior, relaxation and other stress management  skills, ability to obtain effective social support, and compliance with psychotropic medications if prescribed.;Long Term: Emotional wellbeing is indicated by absence of clinically significant psychosocial distress or social isolation.      Tobacco Use Initial Evaluation: History  Smoking Status  . Not on file  Smokeless Tobacco  . Not on file    Copy of goals given to participant.

## 2016-06-04 ENCOUNTER — Encounter: Payer: 59 | Admitting: *Deleted

## 2016-06-04 DIAGNOSIS — I213 ST elevation (STEMI) myocardial infarction of unspecified site: Secondary | ICD-10-CM | POA: Diagnosis not present

## 2016-06-04 NOTE — Progress Notes (Signed)
Daily Session Note  Patient Details  Name: Eugene Woods MRN: [id number] Date of Birth: May 16, 1971 Referring Provider:   Flowsheet Row Cardiac Rehab from 05/30/2016 in [facility] Cardiac and [facility] Rehab  Referring Provider  [name]      Encounter Date: 06/04/2016  Check In:     Session Check In - 06/04/16 0841      Check-In   Location [facility]-Cardiac & Pulmonary Rehab   Staff Present Alberteen Sam, MA, ACSM RCEP, Exercise Physiologist;Susanne Bice, RN, BSN, CCRP;Carroll Enterkin, RN, BSN   Supervising physician immediately available to respond to emergencies See telemetry face sheet for immediately available ER MD   Medication changes reported     No   Fall or balance concerns reported    No   Warm-up and Cool-down Performed on first and last piece of equipment   Resistance Training Performed Yes   VAD Patient? No     Pain Assessment   Currently in Pain? No/denies   Multiple Pain Sites No         Goals Met:  Exercise tolerated well Personal goals reviewed No report of cardiac concerns or symptoms Strength training completed today  Goals Unmet:  Not Applicable  Comments: First full day of exercise!  Patient was oriented to gym and equipment including functions, settings, policies, and procedures.  Patient's individual exercise prescription and treatment plan were reviewed.  All starting workloads were established based on the results of the 6 minute walk test done at initial orientation visit.  The plan for exercise progression was also introduced and progression will be customized based on patient's performance and goals.  We did need to reduce his workload on the XR due to constant chest pain (3/10).  We will continue to monitor pt for chest pain.  We discussed our guidelines for chest pain management and the importance of letting staff know what it going on.    Dr. Emily Filbert is Medical Director for [place] and [facility].

## 2016-06-11 ENCOUNTER — Encounter: Payer: 59 | Attending: Family Medicine | Admitting: *Deleted

## 2016-06-11 DIAGNOSIS — I213 ST elevation (STEMI) myocardial infarction of unspecified site: Secondary | ICD-10-CM | POA: Diagnosis present

## 2016-06-11 NOTE — Progress Notes (Signed)
Daily Session Note  Patient Details  Name: Eugene Woods MRN: [id number] Date of Birth: Jul 17, 1971 Referring Provider:   Flowsheet Row Cardiac Rehab from 05/30/2016 in [facility] Card[facility] Rehab  Referring Provider  [name]      Encounter Date: 06/11/2016  Check In:     Session Check In - 06/11/16 1019      Check-In   Location [facility]-Cardiac & Pulmonary Rehab   Staff Present Gerlene Burdock, RN, Levie Heritage, MA, ACSM RCEP, Exercise Physiologist;Laureen Janell Quiet, RRT, Respiratory Therapist   Supervising physician immediately available to respond to emergencies See telemetry face sheet for immediately available ER MD   Medication changes reported     No   Fall or balance concerns reported    No   Warm-up and Cool-down Performed on first and last piece of equipment   Resistance Training Performed Yes   VAD Patient? No     Pain Assessment   Currently in Pain? No/denies   Multiple Pain Sites No         Goals Met:  Independence with exercise equipment Exercise tolerated well Personal goals reviewed No report of cardiac concerns or symptoms Strength training completed today  Goals Unmet:  Not Applicable  Comments: Pt able to follow exercise prescription today without complaint.  Will continue to monitor for progression. Reviewed home exercise with pt today.  Pt plans to walk at home for exercise.  Reviewed THR, pulse, RPE, sign and symptoms, NTG use, and when to call 911 or MD.  Also discussed weather considerations and indoor options.  Pt voiced understanding. Alberteen Sam, MA, ACSM RCEP 06/11/2016 10:28 AM    Dr. Emily Filbert is Medical Director for [place] and [facility].

## 2016-06-13 DIAGNOSIS — I213 ST elevation (STEMI) myocardial infarction of unspecified site: Secondary | ICD-10-CM

## 2016-06-13 NOTE — Progress Notes (Signed)
Daily Session Note  Patient Details  Name: Eugene Woods MRN: [id number] Date of Birth: December 06, 1970 Referring Provider:   Flowsheet Row Cardiac Rehab from 05/30/2016 in [facility] Cardiac and Pulmonary Rehab  Referring Provider  [name]      Encounter Date: 06/13/2016  Check In:     Session Check In - 06/13/16 0839      Check-In   Location [facility]-Cardiac & Pulmonary Rehab   Staff Present Alberteen Sam, MA, ACSM RCEP, Exercise Physiologist;Amanda Oletta Darter, BA, ACSM CEP, Exercise Physiologist;Carroll Enterkin, RN, BSN   Supervising physician immediately available to respond to emergencies See telemetry face sheet for immediately available ER MD   Medication changes reported     No   Fall or balance concerns reported    No   Warm-up and Cool-down Performed on first and last piece of equipment   Resistance Training Performed Yes   VAD Patient? No     Pain Assessment   Currently in Pain? No/denies   Multiple Pain Sites No         Goals Met:  Independence with exercise equipment Exercise tolerated well No report of cardiac concerns or symptoms Strength training completed today  Goals Unmet:  Not Applicable  Comments: Pt able to follow exercise prescription today without complaint.  Will continue to monitor for progression.    Dr. Emily Filbert is Medical Director for [place] and [facility].

## 2016-06-18 ENCOUNTER — Encounter: Payer: 59 | Admitting: *Deleted

## 2016-06-18 DIAGNOSIS — I213 ST elevation (STEMI) myocardial infarction of unspecified site: Secondary | ICD-10-CM

## 2016-06-18 NOTE — Progress Notes (Signed)
Daily Session Note  Patient Details  Name: Eugene Woods MRN: [id number] Date of Birth: 04-25-1971 Referring Provider:   Flowsheet Row Cardiac Rehab from 05/30/2016 in [facility] Cardiac and Pulmonary Rehab  Referring Provider  [name]      Encounter Date: 06/18/2016  Check In:     Session Check In - 06/18/16 1043      Check-In   Location [facility]-Cardiac & Pulmonary Rehab   Staff Present Heath Lark, RN, BSN, CCRP;Sharla Tankard Luan Pulling, MA, ACSM RCEP, Exercise Physiologist;Laureen Owens Shark, BS, RRT, Respiratory Therapist   Supervising physician immediately available to respond to emergencies See telemetry face sheet for immediately available ER MD   Medication changes reported     Yes   Fall or balance concerns reported    No   Warm-up and Cool-down Performed on first and last piece of equipment   Resistance Training Performed Yes   VAD Patient? No     Pain Assessment   Currently in Pain? No/denies   Multiple Pain Sites No         Goals Met:  Independence with exercise equipment Exercise tolerated well No report of cardiac concerns or symptoms Strength training completed today  Goals Unmet:  Not Applicable  Comments: Pt able to follow exercise prescription today without complaint.  Will continue to monitor for progression.     Dr. Emily Filbert is Medical Director for [place] and [facility].

## 2016-06-19 ENCOUNTER — Encounter: Payer: Self-pay | Admitting: *Deleted

## 2016-06-19 DIAGNOSIS — I213 ST elevation (STEMI) myocardial infarction of unspecified site: Secondary | ICD-10-CM

## 2016-06-19 NOTE — Progress Notes (Signed)
Cardiac Individual Treatment Plan  Patient Details  Name: Eugene Woods MRN: [id number] Date of Birth: Mar 19, 1971 Referring Provider:   Flowsheet Row Cardiac Rehab from 05/30/2016 in [facility] Cardiac and Pulmonary Rehab  Referring Provider  [name]      Initial Encounter Date:  Flowsheet Row Cardiac Rehab from 05/30/2016 in [facility] Cardiac and Pulmonary Rehab  Date  05/30/16  Referring Provider  [name]      Visit Diagnosis: ST elevation myocardial infarction (STEMI), unspecified artery (HCC)  Patient's Home Medications on Admission:  Current Outpatient Prescriptions:  .  aspirin 81 MG chewable tablet, Chew 81 mg by mouth., Disp: , Rfl:  .  atorvastatin (LIPITOR) 80 MG tablet, Take 80 mg by mouth., Disp: , Rfl:  .  furosemide (LASIX) 20 MG tablet, Take 20 mg by mouth., Disp: , Rfl:  .  isosorbide mononitrate (IMDUR) 60 MG 24 hr tablet, Take 60 mg by mouth., Disp: , Rfl:  .  lisinopril (PRINIVIL,ZESTRIL) 20 MG tablet, Take 20 mg by mouth., Disp: , Rfl:  .  metoprolol succinate (TOPROL-XL) 25 MG 24 hr tablet, Take 25 mg by mouth., Disp: , Rfl:  .  nitroGLYCERIN (NITROSTAT) 0.4 MG SL tablet, Place 0.4 mg under the tongue., Disp: , Rfl:  .  prasugrel (EFFIENT) 10 MG TABS tablet, Take 10 mg by mouth., Disp: , Rfl:   Past Medical History: No past medical history on file.  Tobacco Use: History  Smoking Status  . Not on file  Smokeless Tobacco  . Not on file    Labs: Recent Review Flowsheet Data    There is no flowsheet data to display.       Exercise Target Goals:    Exercise Program Goal: Individual exercise prescription set with THRR, safety & activity barriers. Participant demonstrates ability to understand and report RPE using BORG scale, to self-measure pulse accurately, and to acknowledge the importance of the exercise prescription.  Exercise Prescription Goal: Starting with aerobic activity 30 plus minutes a day, 3 days per week for initial exercise prescription.  Provide home exercise prescription and guidelines that participant acknowledges understanding prior to discharge.  Activity Barriers & Risk Stratification:     Activity Barriers & Cardiac Risk Stratification - 05/30/16 1506      Activity Barriers & Cardiac Risk Stratification   Activity Barriers Joint Problems  History of right shoulder surgery   Cardiac Risk Stratification High      6 Minute Walk:     6 Minute Walk    Row Name 05/30/16 1454         6 Minute Walk   Distance 1310 feet     Walk Time 6 minutes     MPH 2.48     METS 3.23     RPE 13     VO2 Peak 11.3     Symptoms No     Resting HR 61 bpm     Resting BP 106/62     Max Ex. HR 94 bpm     Max Ex. BP 126/68        Initial Exercise Prescription:     Initial Exercise Prescription - 05/30/16 1400      Date of Initial Exercise RX and Referring Provider   Date 05/30/16   Referring Provider [name]     Treadmill   MPH 2.5   Grade 0   Minutes 15   METs 2.91     Recumbant Bike   Level 2   Minutes 15  METs 2.9     REL-XR   Level 3   Minutes 15   METs 2.9     T5 Nustep   Level 3   Minutes 15   METs 2.9     Prescription Details   Frequency (times per week) 2   Duration Progress to 45 minutes of aerobic exercise without signs/symptoms of physical distress     Intensity   THRR 40-80% of Max Heartrate 106-151   Ratings of Perceived Exertion 11-13     Progression   Progression Continue to progress workloads to maintain intensity without signs/symptoms of physical distress.     Resistance Training   Training Prescription Yes   Weight 3   Reps 10-15      Perform Capillary Blood Glucose checks as needed.  Exercise Prescription Changes:     Exercise Prescription Changes    Row Name 05/30/16 1400 06/11/16 1000 06/11/16 1600         Exercise Review   Progression  -  - Yes       Response to Exercise   Blood Pressure (Admit) 106/62  - 136/72     Blood Pressure (Exercise) 126/68  -  148/70     Blood Pressure (Exit) 102/32  - 120/70     Heart Rate (Admit) 72 bpm  - 92 bpm     Heart Rate (Exercise) 94 bpm  - 117 bpm     Heart Rate (Exit) 71 bpm  - 88 bpm     Rating of Perceived Exertion (Exercise) 13  - 15     Symptoms  -  - none     Comments  - Home Exercise Guidelines given 06/11/16 Home Exercise Guidelines given 06/11/16     Duration  -  - Progress to 45 minutes of aerobic exercise without signs/symptoms of physical distress     Intensity  -  - THRR unchanged       Progression   Progression  -  - Continue to progress workloads to maintain intensity without signs/symptoms of physical distress.     Average METs  -  - 3.6       Resistance Training   Training Prescription  -  - Yes     Weight  -  - 3 lbs     Reps  -  - 10-15       Interval Training   Interval Training  -  - No       Treadmill   MPH  -  - 2.5     Grade  -  - 0     Minutes  -  - 15     METs  -  - 2.91       Elliptical   Level  -  - 1     Speed  -  - 2.5     Minutes  -  - 15     METs  -  - 6       REL-XR   Level  -  - 3     Minutes  -  - 15     METs  -  - 1.9       Home Exercise Plan   Plans to continue exercise at  - Home  walking Home  walking     Frequency  - Add 4 additional days to program exercise sessions. Add 4 additional days to program exercise sessions.        Exercise  Comments:     Exercise Comments    Row Name 06/04/16 1610 06/11/16 1028 06/11/16 1614       Exercise Comments First full day of exercise!  Patient was oriented to gym and equipment including functions, settings, policies, and procedures.  Patient's individual exercise prescription and treatment plan were reviewed.  All starting workloads were established based on the results of the 6 minute walk test done at initial orientation visit.  The plan for exercise progression was also introduced and progression will be customized based on patient's performance and goals.We did need to reduce his workload on the XR  due to constant chest pain (3/10).  We will continue to monitor pt for chest pain.  We discussed our guidelines for chest pain management and the importance of letting staff know what it going on. Reviewed home exercise with pt today.  Pt plans to walk at home for exercise.  Reviewed THR, pulse, RPE, sign and symptoms, NTG use, and when to call 911 or MD.  Also discussed weather considerations and indoor options.  Pt voiced understanding. Eugene Woods is off to a good start in rehab.  He has already started to learn Woods and starting to increase his activity levels.  We will continue to monitor for progression.        Discharge Exercise Prescription (Final Exercise Prescription Changes):     Exercise Prescription Changes - 06/11/16 1600      Exercise Review   Progression Yes     Response to Exercise   Blood Pressure (Admit) 136/72   Blood Pressure (Exercise) 148/70   Blood Pressure (Exit) 120/70   Heart Rate (Admit) 92 bpm   Heart Rate (Exercise) 117 bpm   Heart Rate (Exit) 88 bpm   Rating of Perceived Exertion (Exercise) 15   Symptoms none   Comments Home Exercise Guidelines given 06/11/16   Duration Progress to 45 minutes of aerobic exercise without signs/symptoms of physical distress   Intensity THRR unchanged     Progression   Progression Continue to progress workloads to maintain intensity without signs/symptoms of physical distress.   Average METs 3.6     Resistance Training   Training Prescription Yes   Weight 3 lbs   Reps 10-15     Interval Training   Interval Training No     Treadmill   MPH 2.5   Grade 0   Minutes 15   METs 2.91     Elliptical   Level 1   Speed 2.5   Minutes 15   METs 6     REL-XR   Level 3   Minutes 15   METs 1.9     Home Exercise Plan   Plans to continue exercise at Home  walking   Frequency Add 4 additional days to program exercise sessions.      Nutrition:  Target Goals: Understanding of nutrition guidelines, daily intake of sodium  1500mg , cholesterol 200mg , calories 30% from fat and 7% or less from saturated fats, daily to have 5 or Woods servings of fruits and vegetables.  Biometrics:     Pre Biometrics - 05/30/16 1453      Pre Biometrics   Height 6' (1.829 m)   Weight (!)  310 lb 4.8 oz (140.8 kg)   Waist Circumference 51.75 inches   Hip Circumference 53.25 inches   Waist to Hip Ratio 0.97 %   BMI (Calculated) 42.2   Single Leg Stand 30 seconds       Nutrition Therapy  Plan and Nutrition Goals:     Nutrition Therapy & Goals - 05/30/16 1504      Intervention Plan   Intervention Prescribe, educate and counsel regarding individualized specific dietary modifications aiming towards targeted core components such as weight, hypertension, lipid management, diabetes, heart failure and other comorbidities.;Nutrition handout(s) given to patient.      Nutrition Discharge: Rate Your Plate Scores:     Nutrition Assessments - 05/30/16 1506      Rate Your Plate Scores   Pre Score 77   Pre Score % 85 %      Nutrition Goals Re-Evaluation:   Psychosocial: Target Goals: Acknowledge presence or absence of depression, maximize coping skills, provide positive support system. Participant is able to verbalize types and ability to use techniques and skills needed for reducing stress and depression.  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 Does report a supportive boss.      Family Dynamics   Good Support System? Yes  Mom and Dad supportive, live in [place], [place]   Comments Single parent of 7 children. Ages 93-26 4 at home.     Barriers   Psychosocial barriers to participate in program There are no identifiable barriers or psychosocial needs.;The patient  should benefit from training in stress management and relaxation.     Screening Interventions   Interventions Encouraged to exercise      Quality of Life Scores:     Quality of Life - 05/30/16 1759      Quality of Life Scores   Health/Function Pre 20 %   Socioeconomic Pre 20.36 %   Psych/Spiritual Pre 20.36 %   Family Pre 26 %   GLOBAL Pre 20.91 %      PHQ-9: Recent Review Flowsheet Data    Depression screen [facility] 2/9 05/30/2016   Decreased Interest 2   Down, Depressed, Hopeless 0   PHQ - 2 Score 2   Altered sleeping 1   Tired, decreased energy 2   Change in appetite 0   Feeling bad or failure about yourself  0   Trouble concentrating 0   Moving slowly or fidgety/restless 0   Suicidal thoughts 0   PHQ-9 Score 5   Difficult doing work/chores Somewhat difficult       Psychosocial Evaluation and Intervention:   Psychosocial Re-Evaluation:   Vocational Rehabilitation: Provide vocational rehab assistance to qualifying candidates.   Vocational Rehab Evaluation & Intervention:     Vocational Rehab - 05/30/16 1507      Initial Vocational Rehab Evaluation & Intervention   Assessment shows need for Vocational Rehabilitation No      Education: Education Goals: Education classes will be provided on a weekly basis, covering required topics. Participant will state understanding/return demonstration of topics presented.  Learning Barriers/Preferences:     Learning Barriers/Preferences - 05/30/16 1507      Learning Barriers/Preferences   Learning Barriers None   Learning Preferences None      Education Topics: General Nutrition Guidelines/Fats and Fiber: -Group instruction provided by verbal, written material, models and posters to present the general guidelines for heart healthy nutrition. Gives an explanation and review of dietary fats and fiber.   Controlling Sodium/Reading Food Labels: -Group verbal and written material  supporting the discussion of  sodium use in heart healthy nutrition. Review and explanation with models, verbal and written materials for utilization of the food label.   Exercise Physiology & Risk Factors: - Group verbal and written instruction with models to review the exercise physiology of the cardiovascular system and associated critical values. Details cardiovascular disease risk factors and the goals associated with each risk factor.   Aerobic Exercise & Resistance Training: - Gives group verbal and written discussion on the health impact of inactivity. On the components of aerobic and resistive training programs and the benefits of this training and how to safely progress through these programs.   Flexibility, Balance, General Exercise Guidelines: - Provides group verbal and written instruction on the benefits of flexibility and balance training programs. Provides general exercise guidelines with specific guidelines to those with heart or lung disease. Demonstration and skill practice provided.   Stress Management: - Provides group verbal and written instruction about the health risks of elevated stress, cause of high stress, and healthy ways to reduce stress.   Depression: - Provides group verbal and written instruction on the correlation between heart/lung disease and depressed mood, treatment options, and the stigmas associated with seeking treatment.   Anatomy & Physiology of the Heart: - Group verbal and written instruction and models provide basic cardiac anatomy and physiology, with the coronary electrical and arterial systems. Review of: AMI, Angina, Valve disease, Heart Failure, Cardiac Arrhythmia, Pacemakers, and the ICD.   Cardiac Procedures: - Group verbal and written instruction and models to describe the testing methods done to diagnose heart disease. Reviews the outcomes of the test results. Describes the treatment choices: Medical Management, Angioplasty, or Coronary Bypass Surgery. Flowsheet  Row Cardiac Rehab from 06/18/2016 in [facility] Cardiac and Pulmonary Rehab  Date  06/04/16  Educator  CE  Instruction Review Code  2- meets goals/outcomes      Cardiac Medications: - Group verbal and written instruction to review commonly prescribed medications for heart disease. Reviews the medication, class of the drug, and side effects. Includes the steps to properly store meds and maintain the prescription regimen. Flowsheet Row Cardiac Rehab from 06/18/2016 in [facility] Cardiac and Pulmonary Rehab  Date  06/13/16  Educator  CE  Instruction Review Code  1- partially meets, needs review/practice [part 2]      Go Sex-Intimacy & Heart Disease, Get SMART - Goal Setting: - Group verbal and written instruction through game format to discuss heart disease and the return to sexual intimacy. Provides group verbal and written material to discuss and apply goal setting through the application of the S.M.A.R.T. Method. Flowsheet Row Cardiac Rehab from 06/18/2016 in [facility] Cardiac and Pulmonary Rehab  Date  06/04/16  Educator  CE  Instruction Review Code  2- meets goals/outcomes      Other Matters of the Heart: - Provides group verbal, written materials and models to describe Heart Failure, Angina, Valve Disease, and Diabetes in the realm of heart disease. Includes description of the disease process and treatment options available to the cardiac patient.   Exercise & Equipment Safety: - Individual verbal instruction and demonstration of equipment use and safety with use of the equipment. Flowsheet Row Cardiac Rehab from 06/18/2016 in [facility] Cardiac and Pulmonary Rehab  Date  05/30/16  Educator  SB  Instruction Review Code  2- meets goals/outcomes      Infection Prevention: - Provides verbal and written material to individual with discussion of infection control including proper hand washing and proper equipment  cleaning during exercise session. Flowsheet Row [facility] Cardiac  [facility] Pulmonary Rehab  Date  05/30/16  Educator  SB  Instruction Review Code  2- meets goals/outcomes      Falls Prevention: - Provides verbal and written material to individual with discussion of falls prevention and safety.   Diabetes: - Individual verbal and written instruction to review signs/symptoms of diabetes, desired ranges of glucose level fasting, after meals and with exercise. Advice that pre and post exercise glucose checks will be done for 3 sessions at entry of program.    Knowledge Questionnaire Score:     Knowledge Questionnaire Score - 05/30/16 1507      Knowledge Questionnaire Score   Pre Score 24/28      Core Components/Risk Factors/Patient Goals at Admission:     Personal Goals and Risk Factors at Admission - 05/30/16 1300      Core Components/Risk Factors/Patient Goals on Admission    Weight Management Yes;Obesity   Intervention Weight Management: Develop a combined nutrition and exercise program designed to reach desired caloric intake, while maintaining appropriate intake of nutrient and fiber, sodium and fats, and appropriate energy expenditure required for the weight goal.;Weight Management: Provide education and appropriate resources to help participant work on and attain dietary goals.   Admit Weight 308 lb (139.7 kg)   Goal Weight: Short Term 299 lb (135.6 kg)   Goal Weight: Long Term 220 lb (99.8 kg)   Expected Outcomes Weight Loss: Understanding of general recommendations for a balanced deficit meal plan, which promotes 1-2 lb weight loss per week and includes a negative energy balance of [phone] kcal/d   Sedentary Yes   Intervention Provide advice, education, support and counseling about physical activity/exercise needs.;Develop an individualized exercise prescription for aerobic and resistive training based on initial evaluation findings, risk stratification, comorbidities and participant's personal goals.   Expected Outcomes Achievement of  increased cardiorespiratory fitness and enhanced flexibility, muscular endurance and strength shown through measurements of functional capacity and personal statement of participant.   Increase Strength and Stamina Yes   Intervention Provide advice, education, support and counseling about physical activity/exercise needs.;Develop an individualized exercise prescription for aerobic and resistive training based on initial evaluation findings, risk stratification, comorbidities and participant's personal goals.   Expected Outcomes Achievement of increased cardiorespiratory fitness and enhanced flexibility, muscular endurance and strength shown through measurements of functional capacity and personal statement of participant.   Hypertension Yes   Intervention Provide education on lifestyle modifcations including regular physical activity/exercise, weight management, moderate sodium restriction and increased consumption of fresh fruit, vegetables, and low fat dairy, alcohol moderation, and smoking cessation.;Monitor prescription use compliance.   Expected Outcomes Short Term: Continued assessment and intervention until BP is < 140/8090mm HG in hypertensive participants. < 130/3380mm HG in hypertensive participants with diabetes, heart failure or chronic kidney disease.;Long Term: Maintenance of blood pressure at goal levels.   Lipids Yes   Intervention Provide education and support for participant on nutrition & aerobic/resistive exercise along with prescribed medications to achieve LDL 70mg , HDL >40mg .   Expected Outcomes Short Term: Participant states understanding of desired cholesterol values and is compliant with medications prescribed. Participant is following exercise prescription and nutrition guidelines.;Long Term: Cholesterol controlled with medications as prescribed, with individualized exercise RX and with personalized nutrition plan. Value goals: LDL < 70mg , HDL > 40 mg.   Stress Yes   Intervention  Offer individual and/or small group education and counseling on adjustment to heart disease, stress  management and health-related lifestyle change. Teach and support self-help strategies.;Refer participants experiencing significant psychosocial distress to appropriate mental health specialists for further evaluation and treatment. When possible, include family members and significant others in education/counseling sessions.   Expected Outcomes Short Term: Participant demonstrates changes in health-related behavior, relaxation and other stress management skills, ability to obtain effective social support, and compliance with psychotropic medications if prescribed.;Long Term: Emotional wellbeing is indicated by absence of clinically significant psychosocial distress or social isolation.      Core Components/Risk Factors/Patient Goals Review:      Goals and Risk Factor Review    Row Name 06/11/16 1020             Core Components/Risk Factors/Patient Goals Review   Personal Goals Review Weight Management/Obesity;Sedentary;Increase Strength and Stamina;Heart Failure;Hypertension;Lipids;Stress       Review Eugene Woods is off to a good start in rehab.  His weight was up again today.  He was noticing increased swelling.  He was encouraged to take an extra lasix and that if it does not help or his weight goes up any Woods this week to call his doctor.  We discussed the importance of weighing daily and watching salt intake to monitor his heart failure symptoms.  He is eating healthy and watching portions, so we also talked about moving Woods frequently.  He did mention SOB at home while resting.  We discussed how this plays into his heart failure and weight management.  His blood pressures have been good, but he has not been taking it at home.  He has not had any problems with his statins.  His stress levels are steady.  We talked about how exercise could be another outlet for him.       Expected Outcomes Eugene Woods will  continue to come to education and exercise class to work on weight loss, stress management, and increasing stamina.  We will also continue to monitor his weight and blood pressures and offer support where we can.          Core Components/Risk Factors/Patient Goals at Discharge (Final Review):      Goals and Risk Factor Review - 06/11/16 1020      Core Components/Risk Factors/Patient Goals Review   Personal Goals Review Weight Management/Obesity;Sedentary;Increase Strength and Stamina;Heart Failure;Hypertension;Lipids;Stress   Review Eugene Woods is off to a good start in rehab.  His weight was up again today.  He was noticing increased swelling.  He was encouraged to take an extra lasix and that if it does not help or his weight goes up any Woods this week to call his doctor.  We discussed the importance of weighing daily and watching salt intake to monitor his heart failure symptoms.  He is eating healthy and watching portions, so we also talked about moving Woods frequently.  He did mention SOB at home while resting.  We discussed how this plays into his heart failure and weight management.  His blood pressures have been good, but he has not been taking it at home.  He has not had any problems with his statins.  His stress levels are steady.  We talked about how exercise could be another outlet for him.   Expected Outcomes Eugene Woods will continue to come to education and exercise class to work on weight loss, stress management, and increasing stamina.  We will also continue to monitor his weight and blood pressures and offer support where we can.      ITP Comments:  ITP Comments    Row Name 05/30/16 1514 06/19/16 0637         ITP Comments Initial ITP created today during medical review.  Documentation of admitting diagnosis can be found in [facility] Encounter on 05/06/2016 30 day review. Continue with ITP unless changes noted by Medical Director at signature of review.         Comments:

## 2016-06-20 ENCOUNTER — Encounter: Payer: 59 | Admitting: *Deleted

## 2016-06-20 DIAGNOSIS — I213 ST elevation (STEMI) myocardial infarction of unspecified site: Secondary | ICD-10-CM

## 2016-06-20 NOTE — Progress Notes (Signed)
Daily Session Note  Patient Details  Name: Eugene Woods MRN: [id number] Date of Birth: [birth year] Referring Provider:   Flowsheet Row Cardiac Rehab from 05/30/2016 in [facility] Cardiac and Pulmonary Rehab  Referring Provider  [name]      Encounter Date: 06/20/2016  Check In:     Session Check In - 06/20/16 1037      Check-In   Location [facility]-Cardiac & Pulmonary Rehab   Staff Present Gerlene Burdock, RN, Vickki Hearing, BA, ACSM CEP, Exercise Physiologist;Kenzel Ruesch Luan Pulling, MA, ACSM RCEP, Exercise Physiologist   Supervising physician immediately available to respond to emergencies See telemetry face sheet for immediately available ER MD   Medication changes reported     No   Fall or balance concerns reported    No   Warm-up and Cool-down Performed on first and last piece of equipment   Resistance Training Performed Yes   VAD Patient? No     Pain Assessment   Currently in Pain? No/denies   Multiple Pain Sites No         Goals Met:  Independence with exercise equipment Exercise tolerated well No report of cardiac concerns or symptoms Strength training completed today  Goals Unmet:  Not Applicable  Comments: Pt able to follow exercise prescription today without complaint.  Will continue to monitor for progression.    Dr. Emily Filbert is Medical Director for [place] and [facility].

## 2016-07-02 ENCOUNTER — Encounter: Payer: 59 | Admitting: *Deleted

## 2016-07-02 DIAGNOSIS — I213 ST elevation (STEMI) myocardial infarction of unspecified site: Secondary | ICD-10-CM | POA: Diagnosis not present

## 2016-07-02 NOTE — Progress Notes (Signed)
Daily Session Note  Patient Details  Name: Eugene Woods MRN: [id number] Date of Birth: May 21, 1971 Referring Provider:   Flowsheet Row Cardiac Rehab from 05/30/2016 in [facility] Cardiac and Pulmonary Rehab  Referring Provider  [name]      Encounter Date: 07/02/2016  Check In:     Session Check In - 07/02/16 0839      Check-In   Staff Present --  Jaclynn Guarneri RN         Goals Met:  Independence with exercise equipment Exercise tolerated well No report of cardiac concerns or symptoms Strength training completed today  Goals Unmet:  Not Applicable  Comments: Doing well with exercise prescription progression.    Dr. Emily Filbert is Medical Director for [place] and [facility].

## 2016-07-04 DIAGNOSIS — I213 ST elevation (STEMI) myocardial infarction of unspecified site: Secondary | ICD-10-CM

## 2016-07-04 NOTE — Progress Notes (Signed)
Daily Session Note  Patient Details  Name: Eugene Woods MRN: [id number] Date of Birth: [birth year] Referring Provider:   Flowsheet Row Cardiac Rehab from 05/30/2016 in [facility] Cardiac and Pulmonary Rehab  Referring Provider  [name]      Encounter Date: 07/04/2016  Check In:     Session Check In - 07/04/16 1010      Check-In   Location [facility]-Cardiac & Pulmonary Rehab   Staff Present Heath Lark, RN, BSN, CCRP;Laureen Owens Shark, BS, RRT, Respiratory Dareen Piano, BA, ACSM CEP, Exercise Physiologist   Supervising physician immediately available to respond to emergencies See telemetry face sheet for immediately available ER MD   Medication changes reported     No   Fall or balance concerns reported    No   Warm-up and Cool-down Performed on first and last piece of equipment   Resistance Training Performed Yes   VAD Patient? No     Pain Assessment   Currently in Pain? No/denies   Multiple Pain Sites No         Goals Met:  Independence with exercise equipment Exercise tolerated well No report of cardiac concerns or symptoms Strength training completed today  Goals Unmet:  Not Applicable  Comments: Pt able to follow exercise prescription today without complaint.  Will continue to monitor for progression.    Dr. Emily Filbert is Medical Director for [place] and [facility].

## 2016-07-09 ENCOUNTER — Encounter: Payer: 59 | Attending: Internal Medicine

## 2016-07-09 DIAGNOSIS — I213 ST elevation (STEMI) myocardial infarction of unspecified site: Secondary | ICD-10-CM | POA: Insufficient documentation

## 2016-07-17 ENCOUNTER — Encounter: Payer: Self-pay | Admitting: *Deleted

## 2016-07-17 DIAGNOSIS — I213 ST elevation (STEMI) myocardial infarction of unspecified site: Secondary | ICD-10-CM

## 2016-07-17 NOTE — Progress Notes (Signed)
Cardiac Individual Treatment Plan  Patient Details  Name: Eugene Woods MRN: [id number] Date of Birth: Mar 19, 1971 Referring Provider:   Flowsheet Row Cardiac Rehab from 05/30/2016 in [facility] Cardiac and Pulmonary Rehab  Referring Provider  [name]      Initial Encounter Date:  Flowsheet Row Cardiac Rehab from 05/30/2016 in [facility] Cardiac and Pulmonary Rehab  Date  05/30/16  Referring Provider  [name]      Visit Diagnosis: ST elevation myocardial infarction (STEMI), unspecified artery (HCC)  Patient's Home Medications on Admission:  Current Outpatient Prescriptions:  .  aspirin 81 MG chewable tablet, Chew 81 mg by mouth., Disp: , Rfl:  .  atorvastatin (LIPITOR) 80 MG tablet, Take 80 mg by mouth., Disp: , Rfl:  .  furosemide (LASIX) 20 MG tablet, Take 20 mg by mouth., Disp: , Rfl:  .  isosorbide mononitrate (IMDUR) 60 MG 24 hr tablet, Take 60 mg by mouth., Disp: , Rfl:  .  lisinopril (PRINIVIL,ZESTRIL) 20 MG tablet, Take 20 mg by mouth., Disp: , Rfl:  .  metoprolol succinate (TOPROL-XL) 25 MG 24 hr tablet, Take 25 mg by mouth., Disp: , Rfl:  .  nitroGLYCERIN (NITROSTAT) 0.4 MG SL tablet, Place 0.4 mg under the tongue., Disp: , Rfl:  .  prasugrel (EFFIENT) 10 MG TABS tablet, Take 10 mg by mouth., Disp: , Rfl:   Past Medical History: No past medical history on file.  Tobacco Use: History  Smoking Status  . Not on file  Smokeless Tobacco  . Not on file    Labs: Recent Review Flowsheet Data    There is no flowsheet data to display.       Exercise Target Goals:    Exercise Program Goal: Individual exercise prescription set with THRR, safety & activity barriers. Participant demonstrates ability to understand and report RPE using BORG scale, to self-measure pulse accurately, and to acknowledge the importance of the exercise prescription.  Exercise Prescription Goal: Starting with aerobic activity 30 plus minutes a day, 3 days per week for initial exercise prescription.  Provide home exercise prescription and guidelines that participant acknowledges understanding prior to discharge.  Activity Barriers & Risk Stratification:     Activity Barriers & Cardiac Risk Stratification - 05/30/16 1506      Activity Barriers & Cardiac Risk Stratification   Activity Barriers Joint Problems  History of right shoulder surgery   Cardiac Risk Stratification High      6 Minute Walk:     6 Minute Walk    Row Name 05/30/16 1454         6 Minute Walk   Distance 1310 feet     Walk Time 6 minutes     MPH 2.48     METS 3.23     RPE 13     VO2 Peak 11.3     Symptoms No     Resting HR 61 bpm     Resting BP 106/62     Max Ex. HR 94 bpm     Max Ex. BP 126/68        Initial Exercise Prescription:     Initial Exercise Prescription - 05/30/16 1400      Date of Initial Exercise RX and Referring Provider   Date 05/30/16   Referring Provider [name]     Treadmill   MPH 2.5   Grade 0   Minutes 15   METs 2.91     Recumbant Bike   Level 2   Minutes 15  METs 2.9     REL-XR   Level 3   Minutes 15   METs 2.9     T5 Nustep   Level 3   Minutes 15   METs 2.9     Prescription Details   Frequency (times per week) 2   Duration Progress to 45 minutes of aerobic exercise without signs/symptoms of physical distress     Intensity   THRR 40-80% of Max Heartrate 106-151   Ratings of Perceived Exertion 11-13     Progression   Progression Continue to progress workloads to maintain intensity without signs/symptoms of physical distress.     Resistance Training   Training Prescription Yes   Weight 3   Reps 10-15      Perform Capillary Blood Glucose checks as needed.  Exercise Prescription Changes:     Exercise Prescription Changes    Row Name 05/30/16 1400 06/11/16 1000 06/11/16 1600 06/26/16 0900 07/10/16 1500     Exercise Review   Progression  -  - Yes Yes Yes     Response to Exercise   Blood Pressure (Admit) 106/62  - 136/72 144/84 118/70    Blood Pressure (Exercise) 126/68  - 148/70 166/80 152/74   Blood Pressure (Exit) 102/32  - 120/70 126/60 104/74   Heart Rate (Admit) 72 bpm  - 92 bpm 79 bpm 61 bpm   Heart Rate (Exercise) 94 bpm  - 117 bpm 131 bpm 116 bpm   Heart Rate (Exit) 71 bpm  - 88 bpm 77 bpm 49 bpm   Rating of Perceived Exertion (Exercise) 13  - 15 16 14    Symptoms  -  - none none none   Comments  - Home Exercise Guidelines given 06/11/16 Home Exercise Guidelines given 06/11/16 Home Exercise Guidelines given 06/11/16 Home Exercise Guidelines given 06/11/16   Duration  -  - Progress to 45 minutes of aerobic exercise without signs/symptoms of physical distress Progress to 45 minutes of aerobic exercise without signs/symptoms of physical distress Progress to 45 minutes of aerobic exercise without signs/symptoms of physical distress   Intensity  -  - THRR unchanged THRR unchanged THRR unchanged     Progression   Progression  -  - Continue to progress workloads to maintain intensity without signs/symptoms of physical distress. Continue to progress workloads to maintain intensity without signs/symptoms of physical distress. Continue to progress workloads to maintain intensity without signs/symptoms of physical distress.   Average METs  -  - 3.6 4.26 4.36     Resistance Training   Training Prescription  -  - Yes Yes Yes   Weight  -  - 3 lbs 3 lbs 4 lbs   Reps  -  - 10-15 10-15 10-15     Interval Training   Interval Training  -  - No Yes Yes   Equipment  -  -  - Treadmill;REL-XR;Elliptical Treadmill;REL-XR;Elliptical   Comments  -  -  - 2 min off 30 sec on 2 min off 30 sec on     Treadmill   MPH  -  - 2.5 3.5 3.5   Grade  -  - 0 0 0   Minutes  -  - 15 15 15    METs  -  - 2.91 3.68 3.68     Elliptical   Level  -  - 1 1 1    Speed  -  - 2.5 2.5 2.5   Minutes  -  - 15 15 15    METs  -  -  6 6.5 6.5     REL-XR   Level  -  - 3 3 3    Minutes  -  - 15 15 15    METs  -  - 1.9 2.6 2.9     Home Exercise Plan   Plans to continue  exercise at  - Home  walking Home  walking Home  walking Home  walking   Frequency  - Add 4 additional days to program exercise sessions. Add 4 additional days to program exercise sessions. Add 4 additional days to program exercise sessions. Add 4 additional days to program exercise sessions.      Exercise Comments:     Exercise Comments    Row Name 06/04/16 9147 06/11/16 1028 06/11/16 1614 06/26/16 0944 07/10/16 1540   Exercise Comments First full day of exercise!  Patient was oriented to gym and equipment including functions, settings, policies, and procedures.  Patient's individual exercise prescription and treatment plan were reviewed.  All starting workloads were established based on the results of the 6 minute walk test done at initial orientation visit.  The plan for exercise progression was also introduced and progression will be customized based on patient's performance and goals.We did need to reduce his workload on the XR due to constant chest pain (3/10).  We will continue to monitor pt for chest pain.  We discussed our guidelines for chest pain management and the importance of letting staff know what it going on. Reviewed home exercise with pt today.  Pt plans to walk at home for exercise.  Reviewed THR, pulse, RPE, sign and symptoms, NTG use, and when to call 911 or MD.  Also discussed weather considerations and indoor options.  Pt voiced understanding. Eugene Woods is off to a good start in rehab.  He has already started to learn Woods and starting to increase his activity levels.  We will continue to monitor for progression. Eugene Woods has been doing well with exercise.  He is up 2.6 METs on the XR.  He is doing intervals on all the equipment.  We will continue to monitor for progression. Eugene Woods continues to do well with exercise.  He is up to 2.9 METs on the XR.  He has been enjoying interval training.  We will continue to monitor for progression.      Discharge Exercise Prescription (Final  Exercise Prescription Changes):     Exercise Prescription Changes - 07/10/16 1500      Exercise Review   Progression Yes     Response to Exercise   Blood Pressure (Admit) 118/70   Blood Pressure (Exercise) 152/74   Blood Pressure (Exit) 104/74   Heart Rate (Admit) 61 bpm   Heart Rate (Exercise) 116 bpm   Heart Rate (Exit) 49 bpm   Rating of Perceived Exertion (Exercise) 14   Symptoms none   Comments Home Exercise Guidelines given 06/11/16   Duration Progress to 45 minutes of aerobic exercise without signs/symptoms of physical distress   Intensity THRR unchanged     Progression   Progression Continue to progress workloads to maintain intensity without signs/symptoms of physical distress.   Average METs 4.36     Resistance Training   Training Prescription Yes   Weight 4 lbs   Reps 10-15     Interval Training   Interval Training Yes   Equipment Treadmill;REL-XR;Elliptical   Comments 2 min off 30 sec on     Treadmill   MPH 3.5   Grade 0   Minutes 15  METs 3.68     Elliptical   Level 1   Speed 2.5   Minutes 15   METs 6.5     REL-XR   Level 3   Minutes 15   METs 2.9     Home Exercise Plan   Plans to continue exercise at Home  walking   Frequency Add 4 additional days to program exercise sessions.      Nutrition:  Target Goals: Understanding of nutrition guidelines, daily intake of sodium 1500mg , cholesterol 200mg , calories 30% from fat and 7% or less from saturated fats, daily to have 5 or Woods servings of fruits and vegetables.  Biometrics:     Pre Biometrics - 05/30/16 1453      Pre Biometrics   Height 6' (1.829 m)   Weight (!)  310 lb 4.8 oz (140.8 kg)   Waist Circumference 51.75 inches   Hip Circumference 53.25 inches   Waist to Hip Ratio 0.97 %   BMI (Calculated) 42.2   Single Leg Stand 30 seconds       Nutrition Therapy Plan and Nutrition Goals:     Nutrition Therapy & Goals - 07/02/16 1248      Nutrition Therapy   Diet Instructed  patient on heart healthy dietary guidelines with a calorie range of 1800-2000 calories, including DASH diet principles   Drug/Food Interactions Statins/Certain Fruits   Protein (specify units) 8   Fiber 30 grams   Whole Grain Foods 3 servings   Saturated Fats 12 max. grams   Fruits and Vegetables 5 servings/day   Sodium 1500 grams     Personal Nutrition Goals   Personal Goal #1 use myfitnesspal app or Lose it app to record food/beverage intake   Personal Goal #2 Team with brother to download food record into his phone for Woods accountability   Personal Goal #3 Look for saturated fat, trans fat and sodium.   Personal Goal #4 Also, balance meals with protein, 3-4 servings of carbohydrate and be generous with non-starchy vegetable portions.     Intervention Plan   Intervention Prescribe, educate and counsel regarding individualized specific dietary modifications aiming towards targeted core components such as weight, hypertension, lipid management, diabetes, heart failure and other comorbidities.;Nutrition handout(s) given to patient.   Expected Outcomes Short Term Goal: Understand basic principles of dietary content, such as calories, fat, sodium, cholesterol and nutrients.;Short Term Goal: A plan has been developed with personal nutrition goals set during dietitian appointment.;Long Term Goal: Adherence to prescribed nutrition plan.      Nutrition Discharge: Rate Your Plate Scores:     Nutrition Assessments - 05/30/16 1506      Rate Your Plate Scores   Pre Score 77   Pre Score % 85 %      Nutrition Goals Re-Evaluation:   Psychosocial: Target Goals: Acknowledge presence or absence of depression, maximize coping skills, provide positive support system. Participant is able to verbalize types and ability to use techniques and skills needed for reducing stress and depression.  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 Does report a supportive boss.      Family Dynamics   Good Support System? Yes  Mom and Dad supportive, live in [place], [place]  Comments Single parent of 7 children. Ages 28-26 4 at home.     Barriers   Psychosocial barriers to participate in program There are no identifiable barriers or psychosocial needs.;The patient should benefit from training in stress management and relaxation.     Screening Interventions   Interventions Encouraged to exercise      Quality of Life Scores:     Quality of Life - 05/30/16 1759      Quality of Life Scores   Health/Function Pre 20 %   Socioeconomic Pre 20.36 %   Psych/Spiritual Pre 20.36 %   Family Pre 26 %   GLOBAL Pre 20.91 %      PHQ-9: Recent Review Flowsheet Data    Depression screen [facility] 2/9 05/30/2016   Decreased Interest 2   Down, Depressed, Hopeless 0   PHQ - 2 Score 2   Altered sleeping 1   Tired, decreased energy 2   Change in appetite 0   Feeling bad or failure about yourself  0   Trouble concentrating 0   Moving slowly or fidgety/restless 0   Suicidal thoughts 0   PHQ-9 Score 5   Difficult doing work/chores Somewhat difficult       Psychosocial Evaluation and Intervention:   Psychosocial Re-Evaluation:   Vocational Rehabilitation: Provide vocational rehab assistance to qualifying candidates.   Vocational Rehab Evaluation & Intervention:     Vocational Rehab - 05/30/16 1507      Initial Vocational Rehab Evaluation & Intervention   Assessment shows need for Vocational Rehabilitation No      Education: Education Goals: Education classes will be provided on a weekly basis, covering required topics. Participant will state understanding/return demonstration of topics presented.  Learning Barriers/Preferences:      Learning Barriers/Preferences - 05/30/16 1507      Learning Barriers/Preferences   Learning Barriers None   Learning Preferences None      Education Topics: General Nutrition Guidelines/Fats and Fiber: -Group instruction provided by verbal, written material, models and posters to present the general guidelines for heart healthy nutrition. Gives an explanation and review of dietary fats and fiber.   Controlling Sodium/Reading Food Labels: -Group verbal and written material supporting the discussion of sodium use in heart healthy nutrition. Review and explanation with models, verbal and written materials for utilization of the food label. Flowsheet Row Cardiac Rehab from 07/02/2016 in [facility] Cardiac and Pulmonary Rehab  Date  07/02/16  Educator  PI  Instruction Review Code  2- meets goals/outcomes      Exercise Physiology & Risk Factors: - Group verbal and written instruction with models to review the exercise physiology of the cardiovascular system and associated critical values. Details cardiovascular disease risk factors and the goals associated with each risk factor.   Aerobic Exercise & Resistance Training: - Gives group verbal and written discussion on the health impact of inactivity. On the components of aerobic and resistive training programs and the benefits of this training and how to safely progress through these programs.   Flexibility, Balance, General Exercise Guidelines: - Provides group verbal and written instruction on the benefits of flexibility and balance training programs. Provides general exercise guidelines with specific guidelines to those with heart or lung disease. Demonstration and skill practice provided.   Stress Management: - Provides group verbal and written instruction about the health risks of elevated stress, cause of high stress, and healthy ways to reduce stress.   Depression: - Provides group verbal and written instruction on the correlation  between  heart/lung disease and depressed mood, treatment options, and the stigmas associated with seeking treatment.   Anatomy & Physiology of the Heart: - Group verbal and written instruction and models provide basic cardiac anatomy and physiology, with the coronary electrical and arterial systems. Review of: AMI, Angina, Valve disease, Heart Failure, Cardiac Arrhythmia, Pacemakers, and the ICD.   Cardiac Procedures: - Group verbal and written instruction and models to describe the testing methods done to diagnose heart disease. Reviews the outcomes of the test results. Describes the treatment choices: Medical Management, Angioplasty, or Coronary Bypass Surgery. Flowsheet Row Cardiac Rehab from 07/02/2016 in [facility] Cardiac and Pulmonary Rehab  Date  06/04/16  Educator  CE  Instruction Review Code  2- meets goals/outcomes      Cardiac Medications: - Group verbal and written instruction to review commonly prescribed medications for heart disease. Reviews the medication, class of the drug, and side effects. Includes the steps to properly store meds and maintain the prescription regimen. Flowsheet Row Cardiac Rehab from 07/02/2016 in [facility] Cardiac and Pulmonary Rehab  Date  06/13/16  Educator  CE  Instruction Review Code  1- partially meets, needs review/practice [part 2]      Go Sex-Intimacy & Heart Disease, Get SMART - Goal Setting: - Group verbal and written instruction through game format to discuss heart disease and the return to sexual intimacy. Provides group verbal and written material to discuss and apply goal setting through the application of the S.M.A.R.T. Method. Flowsheet Row Cardiac Rehab from 07/02/2016 in [facility] Cardiac and Pulmonary Rehab  Date  06/04/16  Educator  CE  Instruction Review Code  2- meets goals/outcomes      Other Matters of the Heart: - Provides group verbal, written materials and models to describe Heart Failure, Angina, Valve Disease, and Diabetes in the  realm of heart disease. Includes description of the disease process and treatment options available to the cardiac patient.   Exercise & Equipment Safety: - Individual verbal instruction and demonstration of equipment use and safety with use of the equipment. Flowsheet Row Cardiac Rehab from 07/02/2016 in [facility] Cardiac and Pulmonary Rehab  Date  05/30/16  Educator  SB  Instruction Review Code  2- meets goals/outcomes      Infection Prevention: - Provides verbal and written material to individual with discussion of infection control including proper hand washing and proper equipment cleaning during exercise session. Flowsheet Row Cardiac Rehab from 07/02/2016 in [facility] Cardiac and Pulmonary Rehab  Date  05/30/16  Educator  SB  Instruction Review Code  2- meets goals/outcomes      Falls Prevention: - Provides verbal and written material to individual with discussion of falls prevention and safety.   Diabetes: - Individual verbal and written instruction to review signs/symptoms of diabetes, desired ranges of glucose level fasting, after meals and with exercise. Advice that pre and post exercise glucose checks will be done for 3 sessions at entry of program.    Knowledge Questionnaire Score:     Knowledge Questionnaire Score - 05/30/16 1507      Knowledge Questionnaire Score   Pre Score 24/28      Core Components/Risk Factors/Patient Goals at Admission:     Personal Goals and Risk Factors at Admission - 05/30/16 1300      Core Components/Risk Factors/Patient Goals on Admission    Weight Management Yes;Obesity   Intervention Weight Management: Develop a combined nutrition and exercise program designed to reach desired caloric intake, while maintaining appropriate intake of nutrient and fiber,  sodium and fats, and appropriate energy expenditure required for the weight goal.;Weight Management: Provide education and appropriate resources to help participant work on and attain  dietary goals.   Admit Weight 308 lb (139.7 kg)   Goal Weight: Short Term 299 lb (135.6 kg)   Goal Weight: Long Term 220 lb (99.8 kg)   Expected Outcomes Weight Loss: Understanding of general recommendations for a balanced deficit meal plan, which promotes 1-2 lb weight loss per week and includes a negative energy balance of [phone] kcal/d   Sedentary Yes   Intervention Provide advice, education, support and counseling about physical activity/exercise needs.;Develop an individualized exercise prescription for aerobic and resistive training based on initial evaluation findings, risk stratification, comorbidities and participant's personal goals.   Expected Outcomes Achievement of increased cardiorespiratory fitness and enhanced flexibility, muscular endurance and strength shown through measurements of functional capacity and personal statement of participant.   Increase Strength and Stamina Yes   Intervention Provide advice, education, support and counseling about physical activity/exercise needs.;Develop an individualized exercise prescription for aerobic and resistive training based on initial evaluation findings, risk stratification, comorbidities and participant's personal goals.   Expected Outcomes Achievement of increased cardiorespiratory fitness and enhanced flexibility, muscular endurance and strength shown through measurements of functional capacity and personal statement of participant.   Hypertension Yes   Intervention Provide education on lifestyle modifcations including regular physical activity/exercise, weight management, moderate sodium restriction and increased consumption of fresh fruit, vegetables, and low fat dairy, alcohol moderation, and smoking cessation.;Monitor prescription use compliance.   Expected Outcomes Short Term: Continued assessment and intervention until BP is < 140/79mm HG in hypertensive participants. < 130/95mm HG in hypertensive participants with diabetes, heart  failure or chronic kidney disease.;Long Term: Maintenance of blood pressure at goal levels.   Lipids Yes   Intervention Provide education and support for participant on nutrition & aerobic/resistive exercise along with prescribed medications to achieve LDL 70mg , HDL >40mg .   Expected Outcomes Short Term: Participant states understanding of desired cholesterol values and is compliant with medications prescribed. Participant is following exercise prescription and nutrition guidelines.;Long Term: Cholesterol controlled with medications as prescribed, with individualized exercise RX and with personalized nutrition plan. Value goals: LDL < 70mg , HDL > 40 mg.   Stress Yes   Intervention Offer individual and/or small group education and counseling on adjustment to heart disease, stress management and health-related lifestyle change. Teach and support self-help strategies.;Refer participants experiencing significant psychosocial distress to appropriate mental health specialists for further evaluation and treatment. When possible, include family members and significant others in education/counseling sessions.   Expected Outcomes Short Term: Participant demonstrates changes in health-related behavior, relaxation and other stress management skills, ability to obtain effective social support, and compliance with psychotropic medications if prescribed.;Long Term: Emotional wellbeing is indicated by absence of clinically significant psychosocial distress or social isolation.      Core Components/Risk Factors/Patient Goals Review:      Goals and Risk Factor Review    Row Name 06/11/16 1020             Core Components/Risk Factors/Patient Goals Review   Personal Goals Review Weight Management/Obesity;Sedentary;Increase Strength and Stamina;Heart Failure;Hypertension;Lipids;Stress       Review Eugene Woods is off to a good start in rehab.  His weight was up again today.  He was noticing increased swelling.  He was  encouraged to take an extra lasix and that if it does not help or his weight goes up any Woods this week to call his  doctor.  We discussed the importance of weighing daily and watching salt intake to monitor his heart failure symptoms.  He is eating healthy and watching portions, so we also talked about moving Woods frequently.  He did mention SOB at home while resting.  We discussed how this plays into his heart failure and weight management.  His blood pressures have been good, but he has not been taking it at home.  He has not had any problems with his statins.  His stress levels are steady.  We talked about how exercise could be another outlet for him.       Expected Outcomes Eugene Woods will continue to come to education and exercise class to work on weight loss, stress management, and increasing stamina.  We will also continue to monitor his weight and blood pressures and offer support where we can.          Core Components/Risk Factors/Patient Goals at Discharge (Final Review):      Goals and Risk Factor Review - 06/11/16 1020      Core Components/Risk Factors/Patient Goals Review   Personal Goals Review Weight Management/Obesity;Sedentary;Increase Strength and Stamina;Heart Failure;Hypertension;Lipids;Stress   Review Eugene Woods is off to a good start in rehab.  His weight was up again today.  He was noticing increased swelling.  He was encouraged to take an extra lasix and that if it does not help or his weight goes up any Woods this week to call his doctor.  We discussed the importance of weighing daily and watching salt intake to monitor his heart failure symptoms.  He is eating healthy and watching portions, so we also talked about moving Woods frequently.  He did mention SOB at home while resting.  We discussed how this plays into his heart failure and weight management.  His blood pressures have been good, but he has not been taking it at home.  He has not had any problems with his statins.  His stress  levels are steady.  We talked about how exercise could be another outlet for him.   Expected Outcomes Eugene Woods will continue to come to education and exercise class to work on weight loss, stress management, and increasing stamina.  We will also continue to monitor his weight and blood pressures and offer support where we can.      ITP Comments:     ITP Comments    Row Name 05/30/16 1514 06/19/16 0637 07/17/16 0626       ITP Comments Initial ITP created today during medical review.  [facility] of admitting diagnosis [facility] 30 day review. Continue with ITP unless changes noted by Medical Director at signature of review. 30 day review. Continue with ITP unless changes noted by Medical Director at signature of review.         Comments:

## 2016-07-23 ENCOUNTER — Encounter: Payer: Self-pay | Admitting: *Deleted

## 2016-07-23 ENCOUNTER — Telehealth: Payer: Self-pay | Admitting: *Deleted

## 2016-07-23 DIAGNOSIS — I213 ST elevation (STEMI) myocardial infarction of unspecified site: Secondary | ICD-10-CM

## 2016-07-23 NOTE — Telephone Encounter (Signed)
Eugene Woods has been out with tooth infection and a cold. He hopes to return Thursday.

## 2016-08-06 ENCOUNTER — Telehealth: Payer: Self-pay | Admitting: *Deleted

## 2016-08-06 ENCOUNTER — Encounter: Payer: Self-pay | Admitting: *Deleted

## 2016-08-06 DIAGNOSIS — I213 ST elevation (STEMI) myocardial infarction of unspecified site: Secondary | ICD-10-CM

## 2016-08-06 NOTE — Telephone Encounter (Signed)
Called to check on status. LMOM 

## 2016-08-13 ENCOUNTER — Encounter: Payer: 59 | Attending: Internal Medicine

## 2016-08-13 DIAGNOSIS — I213 ST elevation (STEMI) myocardial infarction of unspecified site: Secondary | ICD-10-CM | POA: Insufficient documentation

## 2016-08-14 ENCOUNTER — Encounter: Payer: Self-pay | Admitting: *Deleted

## 2016-08-14 DIAGNOSIS — I213 ST elevation (STEMI) myocardial infarction of unspecified site: Secondary | ICD-10-CM

## 2016-08-14 NOTE — Progress Notes (Signed)
Cardiac Individual Treatment Plan  Patient Details  Name: Lovis More MRN: [id number] Date of Birth: Mar 19, 1971 Referring Provider:   Flowsheet Row Cardiac Rehab from 05/30/2016 in [facility] Cardiac and Pulmonary Rehab  Referring Provider  [name]      Initial Encounter Date:  Flowsheet Row Cardiac Rehab from 05/30/2016 in [facility] Cardiac and Pulmonary Rehab  Date  05/30/16  Referring Provider  [name]      Visit Diagnosis: ST elevation myocardial infarction (STEMI), unspecified artery (HCC)  Patient's Home Medications on Admission:  Current Outpatient Prescriptions:  .  aspirin 81 MG chewable tablet, Chew 81 mg by mouth., Disp: , Rfl:  .  atorvastatin (LIPITOR) 80 MG tablet, Take 80 mg by mouth., Disp: , Rfl:  .  furosemide (LASIX) 20 MG tablet, Take 20 mg by mouth., Disp: , Rfl:  .  isosorbide mononitrate (IMDUR) 60 MG 24 hr tablet, Take 60 mg by mouth., Disp: , Rfl:  .  lisinopril (PRINIVIL,ZESTRIL) 20 MG tablet, Take 20 mg by mouth., Disp: , Rfl:  .  metoprolol succinate (TOPROL-XL) 25 MG 24 hr tablet, Take 25 mg by mouth., Disp: , Rfl:  .  nitroGLYCERIN (NITROSTAT) 0.4 MG SL tablet, Place 0.4 mg under the tongue., Disp: , Rfl:  .  prasugrel (EFFIENT) 10 MG TABS tablet, Take 10 mg by mouth., Disp: , Rfl:   Past Medical History: No past medical history on file.  Tobacco Use: History  Smoking Status  . Not on file  Smokeless Tobacco  . Not on file    Labs: Recent Review Flowsheet Data    There is no flowsheet data to display.       Exercise Target Goals:    Exercise Program Goal: Individual exercise prescription set with THRR, safety & activity barriers. Participant demonstrates ability to understand and report RPE using BORG scale, to self-measure pulse accurately, and to acknowledge the importance of the exercise prescription.  Exercise Prescription Goal: Starting with aerobic activity 30 plus minutes a day, 3 days per week for initial exercise prescription.  Provide home exercise prescription and guidelines that participant acknowledges understanding prior to discharge.  Activity Barriers & Risk Stratification:     Activity Barriers & Cardiac Risk Stratification - 05/30/16 1506      Activity Barriers & Cardiac Risk Stratification   Activity Barriers Joint Problems  History of right shoulder surgery   Cardiac Risk Stratification High      6 Minute Walk:     6 Minute Walk    Row Name 05/30/16 1454         6 Minute Walk   Distance 1310 feet     Walk Time 6 minutes     MPH 2.48     METS 3.23     RPE 13     VO2 Peak 11.3     Symptoms No     Resting HR 61 bpm     Resting BP 106/62     Max Ex. HR 94 bpm     Max Ex. BP 126/68        Initial Exercise Prescription:     Initial Exercise Prescription - 05/30/16 1400      Date of Initial Exercise RX and Referring Provider   Date 05/30/16   Referring Provider [name]     Treadmill   MPH 2.5   Grade 0   Minutes 15   METs 2.91     Recumbant Bike   Level 2   Minutes 15  METs 2.9     REL-XR   Level 3   Minutes 15   METs 2.9     T5 Nustep   Level 3   Minutes 15   METs 2.9     Prescription Details   Frequency (times per week) 2   Duration Progress to 45 minutes of aerobic exercise without signs/symptoms of physical distress     Intensity   THRR 40-80% of Max Heartrate 106-151   Ratings of Perceived Exertion 11-13     Progression   Progression Continue to progress workloads to maintain intensity without signs/symptoms of physical distress.     Resistance Training   Training Prescription Yes   Weight 3   Reps 10-15      Perform Capillary Blood Glucose checks as needed.  Exercise Prescription Changes:     Exercise Prescription Changes    Row Name 05/30/16 1400 06/11/16 1000 06/11/16 1600 06/26/16 0900 07/10/16 1500     Exercise Review   Progression  -  - Yes Yes Yes     Response to Exercise   Blood Pressure (Admit) 106/62  - 136/72 144/84 118/70    Blood Pressure (Exercise) 126/68  - 148/70 166/80 152/74   Blood Pressure (Exit) 102/32  - 120/70 126/60 104/74   Heart Rate (Admit) 72 bpm  - 92 bpm 79 bpm 61 bpm   Heart Rate (Exercise) 94 bpm  - 117 bpm 131 bpm 116 bpm   Heart Rate (Exit) 71 bpm  - 88 bpm 77 bpm 49 bpm   Rating of Perceived Exertion (Exercise) 13  - 15 16 14    Symptoms  -  - none none none   Comments  - Home Exercise Guidelines given 06/11/16 Home Exercise Guidelines given 06/11/16 Home Exercise Guidelines given 06/11/16 Home Exercise Guidelines given 06/11/16   Duration  -  - Progress to 45 minutes of aerobic exercise without signs/symptoms of physical distress Progress to 45 minutes of aerobic exercise without signs/symptoms of physical distress Progress to 45 minutes of aerobic exercise without signs/symptoms of physical distress   Intensity  -  - THRR unchanged THRR unchanged THRR unchanged     Progression   Progression  -  - Continue to progress workloads to maintain intensity without signs/symptoms of physical distress. Continue to progress workloads to maintain intensity without signs/symptoms of physical distress. Continue to progress workloads to maintain intensity without signs/symptoms of physical distress.   Average METs  -  - 3.6 4.26 4.36     Resistance Training   Training Prescription  -  - Yes Yes Yes   Weight  -  - 3 lbs 3 lbs 4 lbs   Reps  -  - 10-15 10-15 10-15     Interval Training   Interval Training  -  - No Yes Yes   Equipment  -  -  - Treadmill;REL-XR;Elliptical Treadmill;REL-XR;Elliptical   Comments  -  -  - 2 min off 30 sec on 2 min off 30 sec on     Treadmill   MPH  -  - 2.5 3.5 3.5   Grade  -  - 0 0 0   Minutes  -  - 15 15 15    METs  -  - 2.91 3.68 3.68     Elliptical   Level  -  - 1 1 1    Speed  -  - 2.5 2.5 2.5   Minutes  -  - 15 15 15    METs  -  -  6 6.5 6.5     REL-XR   Level  -  - 3 3 3    Minutes  -  - 15 15 15    METs  -  - 1.9 2.6 2.9     Home Exercise Plan   Plans to continue  exercise at  - Home  walking Home  walking Home  walking Home  walking   Frequency  - Add 4 additional days to program exercise sessions. Add 4 additional days to program exercise sessions. Add 4 additional days to program exercise sessions. Add 4 additional days to program exercise sessions.      Exercise Comments:     Exercise Comments    Row Name 06/04/16 1610 06/11/16 1028 06/11/16 1614 06/26/16 0944 07/10/16 1540   Exercise Comments First full day of exercise!  Patient was oriented to gym and equipment including functions, settings, policies, and procedures.  Patient's individual exercise prescription and treatment plan were reviewed.  All starting workloads were established based on the results of the 6 minute walk test done at initial orientation visit.  The plan for exercise progression was also introduced and progression will be customized based on patient's performance and goals.We did need to reduce his workload on the XR due to constant chest pain (3/10).  We will continue to monitor pt for chest pain.  We discussed our guidelines for chest pain management and the importance of letting staff know what it going on. Reviewed home exercise with pt today.  Pt plans to walk at home for exercise.  Reviewed THR, pulse, RPE, sign and symptoms, NTG use, and when to call 911 or MD.  Also discussed weather considerations and indoor options.  Pt voiced understanding. Jaire is off to a good start in rehab.  He has already started to learn more and starting to increase his activity levels.  We will continue to monitor for progression. Emmanuel has been doing well with exercise.  He is up 2.6 METs on the XR.  He is doing intervals on all the equipment.  We will continue to monitor for progression. Arin continues to do well with exercise.  He is up to 2.9 METs on the XR.  He has been enjoying interval training.  We will continue to monitor for progression.   Row Name 07/23/16 1435 08/06/16 1541          Exercise Comments Davidjames has been out sick since last review. Duron has continued to be out.         Discharge Exercise Prescription (Final Exercise Prescription Changes):     Exercise Prescription Changes - 07/10/16 1500      Exercise Review   Progression Yes     Response to Exercise   Blood Pressure (Admit) 118/70   Blood Pressure (Exercise) 152/74   Blood Pressure (Exit) 104/74   Heart Rate (Admit) 61 bpm   Heart Rate (Exercise) 116 bpm   Heart Rate (Exit) 49 bpm   Rating of Perceived Exertion (Exercise) 14   Symptoms none   Comments Home Exercise Guidelines given 06/11/16   Duration Progress to 45 minutes of aerobic exercise without signs/symptoms of physical distress   Intensity THRR unchanged     Progression   Progression Continue to progress workloads to maintain intensity without signs/symptoms of physical distress.   Average METs 4.36     Resistance Training   Training Prescription Yes   Weight 4 lbs   Reps 10-15     Interval Training  Interval Training Yes   Equipment Treadmill;REL-XR;Elliptical   Comments 2 min off 30 sec on     Treadmill   MPH 3.5   Grade 0   Minutes 15   METs 3.68     Elliptical   Level 1   Speed 2.5   Minutes 15   METs 6.5     REL-XR   Level 3   Minutes 15   METs 2.9     Home Exercise Plan   Plans to continue exercise at Home  walking   Frequency Add 4 additional days to program exercise sessions.      Nutrition:  Target Goals: Understanding of nutrition guidelines, daily intake of sodium 1500mg , cholesterol 200mg , calories 30% from fat and 7% or less from saturated fats, daily to have 5 or more servings of fruits and vegetables.  Biometrics:     Pre Biometrics - 05/30/16 1453      Pre Biometrics   Height 6' (1.829 m)   Weight (!)  310 lb 4.8 oz (140.8 kg)   Waist Circumference 51.75 inches   Hip Circumference 53.25 inches   Waist to Hip Ratio 0.97 %   BMI (Calculated) 42.2   Single Leg Stand 30 seconds        Nutrition Therapy Plan and Nutrition Goals:     Nutrition Therapy & Goals - 07/02/16 1248      Nutrition Therapy   Diet Instructed patient on heart healthy dietary guidelines with a calorie range of 1800-2000 calories, including DASH diet principles   Drug/Food Interactions Statins/Certain Fruits   Protein (specify units) 8   Fiber 30 grams   Whole Grain Foods 3 servings   Saturated Fats 12 max. grams   Fruits and Vegetables 5 servings/day   Sodium 1500 grams     Personal Nutrition Goals   Personal Goal #1 use myfitnesspal app or Lose it app to record food/beverage intake   Personal Goal #2 Team with brother to download food record into his phone for more accountability   Personal Goal #3 Look for saturated fat, trans fat and sodium.   Personal Goal #4 Also, balance meals with protein, 3-4 servings of carbohydrate and be generous with non-starchy vegetable portions.     Intervention Plan   Intervention Prescribe, educate and counsel regarding individualized specific dietary modifications aiming towards targeted core components such as weight, hypertension, lipid management, diabetes, heart failure and other comorbidities.;Nutrition handout(s) given to patient.   Expected Outcomes Short Term Goal: Understand basic principles of dietary content, such as calories, fat, sodium, cholesterol and nutrients.;Short Term Goal: A plan has been developed with personal nutrition goals set during dietitian appointment.;Long Term Goal: Adherence to prescribed nutrition plan.      Nutrition Discharge: Rate Your Plate Scores:     Nutrition Assessments - 05/30/16 1506      Rate Your Plate Scores   Pre Score 77   Pre Score % 85 %      Nutrition Goals Re-Evaluation:   Psychosocial: Target Goals: Acknowledge presence or absence of depression, maximize coping skills, provide positive support system. Participant is able to verbalize types and ability to use techniques and skills needed for  reducing stress and depression.  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 Does report a supportive boss.      Family Dynamics   Good Support System? Yes  Mom and Dad supportive, live in [place], [place]   Comments Single parent of 7 children. Ages 70-26 4 at home.     Barriers   Psychosocial barriers to participate in program There are no identifiable barriers or psychosocial needs.;The patient should benefit from training in stress management and relaxation.     Screening Interventions   Interventions Encouraged to exercise      Quality of Life Scores:     Quality of Life - 05/30/16 1759      Quality of Life Scores   Health/Function Pre 20 %   Socioeconomic Pre 20.36 %   Psych/Spiritual Pre 20.36 %   Family Pre 26 %   GLOBAL Pre 20.91 %      PHQ-9: Recent Review Flowsheet Data    Depression screen [facility] 2/9 05/30/2016   Decreased Interest 2   Down, Depressed, Hopeless 0   PHQ - 2 Score 2   Altered sleeping 1   Tired, decreased energy 2   Change in appetite 0   Feeling bad or failure about yourself  0   Trouble concentrating 0   Moving slowly or fidgety/restless 0   Suicidal thoughts 0   PHQ-9 Score 5   Difficult doing work/chores Somewhat difficult       Psychosocial Evaluation and Intervention:   Psychosocial Re-Evaluation:   Vocational Rehabilitation: Provide vocational rehab assistance to qualifying candidates.   Vocational Rehab Evaluation & Intervention:     Vocational Rehab - 05/30/16 1507      Initial Vocational Rehab Evaluation & Intervention   Assessment shows need for Vocational Rehabilitation No      Education: Education Goals: Education classes will be provided on a weekly basis,  covering required topics. Participant will state understanding/return demonstration of topics presented.  Learning Barriers/Preferences:     Learning Barriers/Preferences - 05/30/16 1507      Learning Barriers/Preferences   Learning Barriers None   Learning Preferences None      Education Topics: General Nutrition Guidelines/Fats and Fiber: -Group instruction provided by verbal, written material, models and posters to present the general guidelines for heart healthy nutrition. Gives an explanation and review of dietary fats and fiber.   Controlling Sodium/Reading Food Labels: -Group verbal and written material supporting the discussion of sodium use in heart healthy nutrition. Review and explanation with models, verbal and written materials for utilization of the food label. Flowsheet Row Cardiac Rehab from 07/02/2016 in [facility] Cardiac and Pulmonary Rehab  Date  07/02/16  Educator  PI  Instruction Review Code  2- meets goals/outcomes      Exercise Physiology & Risk Factors: - Group verbal and written instruction with models to review the exercise physiology of the cardiovascular system and associated critical values. Details cardiovascular disease risk factors and the goals associated with each risk factor.   Aerobic Exercise & Resistance Training: - Gives group verbal and written discussion on the health impact of inactivity. On the components of aerobic and resistive training programs and the benefits of this training and how to safely progress through these programs.   Flexibility, Balance, General Exercise Guidelines: - Provides group verbal and written instruction on the benefits of flexibility and balance training programs. Provides general exercise guidelines with specific guidelines to those with heart or lung disease. Demonstration and skill practice provided.   Stress Management: - Provides group verbal  and written instruction about the health risks of elevated stress,  cause of high stress, and healthy ways to reduce stress.   Depression: - Provides group verbal and written instruction on the correlation between heart/lung disease and depressed mood, treatment options, and the stigmas associated with seeking treatment.   Anatomy & Physiology of the Heart: - Group verbal and written instruction and models provide basic cardiac anatomy and physiology, with the coronary electrical and arterial systems. Review of: AMI, Angina, Valve disease, Heart Failure, Cardiac Arrhythmia, Pacemakers, and the ICD.   Cardiac Procedures: - Group verbal and written instruction and models to describe the testing methods done to diagnose heart disease. Reviews the outcomes of the test results. Describes the treatment choices: Medical Management, Angioplasty, or Coronary Bypass Surgery. Flowsheet Row [facility]  Date  06/04/16  Educator  CE  Instruction Review Code  2- meets goals/outcomes      Cardiac Medications: - Group verbal and written instruction to review commonly prescribed medications for heart disease. Reviews the medication, class of the drug, and side effects. Includes the steps to properly store meds and maintain the prescription regimen. Flowsheet Row [facility]  Date  06/13/16  Educator  CE  Instruction Review Code  1- partially meets, needs review/practice [part 2]      Go Sex-Intimacy & Heart Disease, Get SMART - Goal Setting: - Group verbal and written instruction through game format to discuss heart disease and the return to sexual intimacy. Provides group verbal and written material to discuss and apply goal setting through the application of the S.M.A.R.T. Method. Flowsheet Row [facility]  Date  06/04/16  Educator  CE  Instruction Review Code  2- meets goals/outcomes      Other Matters  of the Heart: - Provides group verbal, written materials and models to describe Heart Failure, Angina, Valve Disease, and Diabetes in the realm of heart disease. Includes description of the disease process and treatment options available to the cardiac patient.   Exercise & Equipment Safety: - Individual verbal instruction and demonstration of equipment use and safety with use of the equipment. Flowsheet Row Card[facility]hab [facility]  Date  05/30/16  Educator  SB  Instruction Review Code  2- meets goals/outcomes      Infection Prevention: - Provides verbal and written material to individual with discussion of infection control including proper hand washing and proper equipment cleaning during exercise session. Flowsheet [facility]  Date  05/30/16  Educator  SB  Instruction Review Code  2- meets goals/outcomes      Falls Prevention: - Provides verbal and written material to individual with discussion of falls prevention and safety.   Diabetes: - Individual verbal and written instruction to review signs/symptoms of diabetes, desired ranges of glucose level fasting, after meals and with exercise. Advice that pre and post exercise glucose checks will be done for 3 sessions at entry of program.    Knowledge Questionnaire Score:     Knowledge Questionnaire Score - 05/30/16 1507      Knowledge Questionnaire Score   Pre Score 24/28      Core Components/Risk Factors/Patient Goals at Admission:     Personal Goals and Risk Factors at Admission - 05/30/16 1300      Core Components/Risk Factors/Patient  Goals on Admission    Weight Management Yes;Obesity   Intervention Weight Management: Develop a combined nutrition and exercise program designed to reach desired caloric intake, while maintaining appropriate intake of nutrient and fiber, sodium and fats, and appropriate energy expenditure  required for the weight goal.;Weight Management: Provide education and appropriate resources to help participant work on and attain dietary goals.   Admit Weight 308 lb (139.7 kg)   Goal Weight: Short Term 299 lb (135.6 kg)   Goal Weight: Long Term 220 lb (99.8 kg)   Expected Outcomes Weight Loss: Understanding of general recommendations for a balanced deficit meal plan, which promotes 1-2 lb weight loss per week and includes a negative energy balance of [phone] kcal/d   Sedentary Yes   Intervention Provide advice, education, support and counseling about physical activity/exercise needs.;Develop an individualized exercise prescription for aerobic and resistive training based on initial evaluation findings, risk stratification, comorbidities and participant's personal goals.   Expected Outcomes Achievement of increased cardiorespiratory fitness and enhanced flexibility, muscular endurance and strength shown through measurements of functional capacity and personal statement of participant.   Increase Strength and Stamina Yes   Intervention Provide advice, education, support and counseling about physical activity/exercise needs.;Develop an individualized exercise prescription for aerobic and resistive training based on initial evaluation findings, risk stratification, comorbidities and participant's personal goals.   Expected Outcomes Achievement of increased cardiorespiratory fitness and enhanced flexibility, muscular endurance and strength shown through measurements of functional capacity and personal statement of participant.   Hypertension Yes   Intervention Provide education on lifestyle modifcations including regular physical activity/exercise, weight management, moderate sodium restriction and increased consumption of fresh fruit, vegetables, and low fat dairy, alcohol moderation, and smoking cessation.;Monitor prescription use compliance.   Expected Outcomes Short Term: Continued assessment and  intervention until BP is < 140/20mm HG in hypertensive participants. < 130/76mm HG in hypertensive participants with diabetes, heart failure or chronic kidney disease.;Long Term: Maintenance of blood pressure at goal levels.   Lipids Yes   Intervention Provide education and support for participant on nutrition & aerobic/resistive exercise along with prescribed medications to achieve LDL 70mg , HDL >40mg .   Expected Outcomes Short Term: Participant states understanding of desired cholesterol values and is compliant with medications prescribed. Participant is following exercise prescription and nutrition guidelines.;Long Term: Cholesterol controlled with medications as prescribed, with individualized exercise RX and with personalized nutrition plan. Value goals: LDL < 70mg , HDL > 40 mg.   Stress Yes   Intervention Offer individual and/or small group education and counseling on adjustment to heart disease, stress management and health-related lifestyle change. Teach and support self-help strategies.;Refer participants experiencing significant psychosocial distress to appropriate mental health specialists for further evaluation and treatment. When possible, include family members and significant others in education/counseling sessions.   Expected Outcomes Short Term: Participant demonstrates changes in health-related behavior, relaxation and other stress management skills, ability to obtain effective social support, and compliance with psychotropic medications if prescribed.;Long Term: Emotional wellbeing is indicated by absence of clinically significant psychosocial distress or social isolation.      Core Components/Risk Factors/Patient Goals Review:      Goals and Risk Factor Review    Row Name 06/11/16 1020             Core Components/Risk Factors/Patient Goals Review   Personal Goals Review Weight Management/Obesity;Sedentary;Increase Strength and Stamina;Heart Failure;Hypertension;Lipids;Stress        Review Wilson is off to a good start in rehab.  His weight was up again  today.  He was noticing increased swelling.  He was encouraged to take an extra lasix and that if it does not help or his weight goes up any more this week to call his doctor.  We discussed the importance of weighing daily and watching salt intake to monitor his heart failure symptoms.  He is eating healthy and watching portions, so we also talked about moving more frequently.  He did mention SOB at home while resting.  We discussed how this plays into his heart failure and weight management.  His blood pressures have been good, but he has not been taking it at home.  He has not had any problems with his statins.  His stress levels are steady.  We talked about how exercise could be another outlet for him.       Expected Outcomes Garo will continue to come to education and exercise class to work on weight loss, stress management, and increasing stamina.  We will also continue to monitor his weight and blood pressures and offer support where we can.          Core Components/Risk Factors/Patient Goals at Discharge (Final Review):      Goals and Risk Factor Review - 06/11/16 1020      Core Components/Risk Factors/Patient Goals Review   Personal Goals Review Weight Management/Obesity;Sedentary;Increase Strength and Stamina;Heart Failure;Hypertension;Lipids;Stress   Review Taven is off to a good start in rehab.  His weight was up again today.  He was noticing increased swelling.  He was encouraged to take an extra lasix and that if it does not help or his weight goes up any more this week to call his doctor.  We discussed the importance of weighing daily and watching salt intake to monitor his heart failure symptoms.  He is eating healthy and watching portions, so we also talked about moving more frequently.  He did mention SOB at home while resting.  We discussed how this plays into his heart failure and weight management.  His blood  pressures have been good, but he has not been taking it at home.  He has not had any problems with his statins.  His stress levels are steady.  We talked about how exercise could be another outlet for him.   Expected Outcomes Brandn will continue to come to education and exercise class to work on weight loss, stress management, and increasing stamina.  We will also continue to monitor his weight and blood pressures and offer support where we can.      ITP Comments:     ITP Comments    Row Name 05/30/16 1514 06/19/16 4098 07/17/16 0626 07/23/16 1434 08/06/16 1541   ITP Comments Initial ITP created today during medical review.  Documentation of admitting diagnosis can be found in [facility] Encounter on 05/06/2016 30 day review. Continue with ITP unless changes noted by Medical Director at signature of review. 30 day review. Continue with ITP unless changes noted by Medical Director at signature of review.  Johnavon has been out with tooth infection and a cold.  He hopes to return Thursday. Called to check on status.  [facility]   Row Name 08/14/16 0652           ITP Comments 30 day review completed for Medical Director physician review and signature. Continue ITP unless changes made by physician. last visit 07/04/2016          Comments:

## 2016-08-21 ENCOUNTER — Encounter: Payer: Self-pay | Admitting: *Deleted

## 2016-08-21 ENCOUNTER — Telehealth: Payer: Self-pay | Admitting: *Deleted

## 2016-08-21 DIAGNOSIS — I213 ST elevation (STEMI) myocardial infarction of unspecified site: Secondary | ICD-10-CM

## 2016-08-21 NOTE — Progress Notes (Signed)
Discharge Summary  Patient Details  Name: Eugene Woods MRN: [id number] Date of Birth: 1970-10-11 Referring Provider:   Flowsheet Row [facility]  Referring Provider  [name]       Number of Visits: 14  Reason for Discharge:  Early Exit:  Personal  Smoking History:  History  Smoking Status  . Not on file  Smokeless Tobacco  . Not on file    Diagnosis:  ST elevation myocardial infarction (STEMI), unspecified artery (HCC)  ADL [facility]SD:   Initial Exercise Prescription:     Initial Exercise Prescription - 05/30/16 1400      Date of Initial Exercise RX and Referring Provider   Date 05/30/16   Referring Provider [name]     Treadmill   MPH 2.5   Grade 0   Minutes 15   METs 2.91     Recumbant Bike   Level 2   Minutes 15   METs 2.9     REL-XR   Level 3   Minutes 15   METs 2.9     T5 Nustep   Level 3   Minutes 15   METs 2.9     Prescription Details   Frequency (times per week) 2   Duration Progress to 45 minutes of aerobic exercise without signs/symptoms of physical distress     Intensity   THRR 40-80% of Max Heartrate 106-151   Ratings of Perceived Exertion 11-13     Progression   Progression Continue to progress workloads to maintain intensity without signs/symptoms of physical distress.     Resistance Training   Training Prescription Yes   Weight 3   Reps 10-15      Discharge Exercise Prescription (Final Exercise Prescription Changes):     Exercise Prescription Changes - 07/10/16 1500      Exercise Review   Progression Yes     Response to Exercise   Blood Pressure (Admit) 118/70   Blood Pressure (Exercise) 152/74   Blood Pressure (Exit) 104/74   Heart Rate (Admit) 61 bpm   Heart Rate (Exercise) 116 bpm   Heart Rate (Exit) 49 bpm   Rating of Perceived Exertion (Exercise) 14   Symptoms none   Comments Home Exercise Guidelines given 06/11/16   Duration Progress to 45 minutes of  aerobic exercise without signs/symptoms of physical distress   Intensity THRR unchanged     Progression   Progression Continue to progress workloads to maintain intensity without signs/symptoms of physical distress.   Average METs 4.36     Resistance Training   Training Prescription Yes   Weight 4 lbs   Reps 10-15     Interval Training   Interval Training Yes   Equipment Treadmill;REL-XR;Elliptical   Comments 2 min off 30 sec on     Treadmill   MPH 3.5   Grade 0   Minutes 15   METs 3.68     Elliptical   Level 1   Speed 2.5   Minutes 15   METs 6.5     REL-XR   Level 3   Minutes 15   METs 2.9     Home Exercise Plan   Plans to continue exercise at Home  walking   Frequency Add 4 additional days to program exercise sessions.      Functional Capacity:     6 Minute Walk    Row Name 05/30/16 1454         6 Minute Walk  Distance 1310 feet     Walk Time 6 minutes     MPH 2.48     METS 3.23     RPE 13     VO2 Peak 11.3     Symptoms No     Resting HR 61 bpm     Resting BP 106/62     Max Ex. HR 94 bpm     Max Ex. BP 126/68        Psychological, QOL, Others - Outcomes: PHQ 2/9: Depression screen PHQ 2/9 05/30/2016  Decreased Interest 2  Down, Depressed, Hopeless 0  PHQ - 2 Score 2  Altered sleeping 1  Tired, decreased energy 2  Change in appetite 0  Feeling bad or failure about yourself  0  Trouble concentrating 0  Moving slowly or fidgety/restless 0  Suicidal thoughts 0  PHQ-9 Score 5  Difficult doing work/chores Somewhat difficult    Quality of Life:     Quality of Life - 05/30/16 1759      Quality of Life Scores   Health/Function Pre 20 %   Socioeconomic Pre 20.36 %   Psych/Spiritual Pre 20.36 %   Family Pre 26 %   GLOBAL Pre 20.91 %      Personal Goals: Goals established at orientation with interventions provided to work toward goal.     Personal Goals and Risk Factors at Admission - 05/30/16 1300      Core Components/Risk  Factors/Patient Goals on Admission    Weight Management Yes;Obesity   Intervention Weight Management: Develop a combined nutrition and exercise program designed to reach desired caloric intake, while maintaining appropriate intake of nutrient and fiber, sodium and fats, and appropriate energy expenditure required for the weight goal.;Weight Management: Provide education and appropriate resources to help participant work on and attain dietary goals.   Admit Weight 308 lb (139.7 kg)   Goal Weight: Short Term 299 lb (135.6 kg)   Goal Weight: Long Term 220 lb (99.8 kg)   Expected Outcomes Weight Loss: Understanding of general recommendations for a balanced deficit meal plan, which promotes 1-2 lb weight loss per week and includes a negative energy balance of [phone] kcal/d   Sedentary Yes   Intervention Provide advice, education, support and counseling about physical activity/exercise needs.;Develop an individualized exercise prescription for aerobic and resistive training based on initial evaluation findings, risk stratification, comorbidities and participant's personal goals.   Expected Outcomes Achievement of increased cardiorespiratory fitness and enhanced flexibility, muscular endurance and strength shown through measurements of functional capacity and personal statement of participant.   Increase Strength and Stamina Yes   Intervention Provide advice, education, support and counseling about physical activity/exercise needs.;Develop an individualized exercise prescription for aerobic and resistive training based on initial evaluation findings, risk stratification, comorbidities and participant's personal goals.   Expected Outcomes Achievement of increased cardiorespiratory fitness and enhanced flexibility, muscular endurance and strength shown through measurements of functional capacity and personal statement of participant.   Hypertension Yes   Intervention Provide education on lifestyle  modifcations including regular physical activity/exercise, weight management, moderate sodium restriction and increased consumption of fresh fruit, vegetables, and low fat dairy, alcohol moderation, and smoking cessation.;Monitor prescription use compliance.   Expected Outcomes Short Term: Continued assessment and intervention until BP is < 140/57mm HG in hypertensive participants. < 130/94mm HG in hypertensive participants with diabetes, heart failure or chronic kidney disease.;Long Term: Maintenance of blood pressure at goal levels.   Lipids Yes   Intervention Provide education and support  for participant on nutrition & aerobic/resistive exercise along with prescribed medications to achieve LDL 70mg , HDL >40mg .   Expected Outcomes Short Term: Participant states understanding of desired cholesterol values and is compliant with medications prescribed. Participant is following exercise prescription and nutrition guidelines.;Long Term: Cholesterol controlled with medications as prescribed, with individualized exercise RX and with personalized nutrition plan. Value goals: LDL < 70mg , HDL > 40 mg.   Stress Yes   Intervention Offer individual and/or small group education and counseling on adjustment to heart disease, stress management and health-related lifestyle change. Teach and support self-help strategies.;Refer participants experiencing significant psychosocial distress to appropriate mental health specialists for further evaluation and treatment. When possible, include family members and significant others in education/counseling sessions.   Expected Outcomes Short Term: Participant demonstrates changes in health-related behavior, relaxation and other stress management skills, ability to obtain effective social support, and compliance with psychotropic medications if prescribed.;Long Term: Emotional wellbeing is indicated by absence of clinically significant psychosocial distress or social isolation.        Personal Goals Discharge:     Goals and Risk Factor Review    Row Name 06/11/16 1020             Core Components/Risk Factors/Patient Goals Review   Personal Goals Review Weight Management/Obesity;Sedentary;Increase Strength and Stamina;Heart Failure;Hypertension;Lipids;Stress       Review Obe is off to a good start in rehab.  His weight was up again today.  He was noticing increased swelling.  He was encouraged to take an extra lasix and that if it does not help or his weight goes up any more this week to call his doctor.  We discussed the importance of weighing daily and watching salt intake to monitor his heart failure symptoms.  He is eating healthy and watching portions, so we also talked about moving more frequently.  He did mention SOB at home while resting.  We discussed how this plays into his heart failure and weight management.  His blood pressures have been good, but he has not been taking it at home.  He has not had any problems with his statins.  His stress levels are steady.  We talked about how exercise could be another outlet for him.       Expected Outcomes Robertt will continue to come to education and exercise class to work on weight loss, stress management, and increasing stamina.  We will also continue to monitor his weight and blood pressures and offer support where we can.          Nutrition & Weight - Outcomes:     Pre Biometrics - 05/30/16 1453      Pre Biometrics   Height 6' (1.829 m)   Weight (!)  310 lb 4.8 oz (140.8 kg)   Waist Circumference 51.75 inches   Hip Circumference 53.25 inches   Waist to Hip Ratio 0.97 %   BMI (Calculated) 42.2   Single Leg Stand 30 seconds       Nutrition:     Nutrition Therapy & Goals - 07/02/16 1248      Nutrition Therapy   Diet Instructed patient on heart healthy dietary guidelines with a calorie range of 1800-2000 calories, including DASH diet principles   Drug/Food Interactions Statins/Certain Fruits   Protein  (specify units) 8   Fiber 30 grams   Whole Grain Foods 3 servings   Saturated Fats 12 max. grams   Fruits and Vegetables 5 servings/day   Sodium 1500 grams  Personal Nutrition Goals   Personal Goal #1 use myfitnesspal app or Lose it app to record food/beverage intake   Personal Goal #2 Team with brother to download food record into his phone for more accountability   Personal Goal #3 Look for saturated fat, trans fat and sodium.   Personal Goal #4 Also, balance meals with protein, 3-4 servings of carbohydrate and be generous with non-starchy vegetable portions.     Intervention Plan   Intervention Prescribe, educate and counsel regarding individualized specific dietary modifications aiming towards targeted core components such as weight, hypertension, lipid management, diabetes, heart failure and other comorbidities.;Nutrition handout(s) given to patient.   Expected Outcomes Short Term Goal: Understand basic principles of dietary content, such as calories, fat, sodium, cholesterol and nutrients.;Short Term Goal: A plan has been developed with personal nutrition goals set during dietitian appointment.;Long Term Goal: Adherence to prescribed nutrition plan.      Nutrition Discharge:     Nutrition Assessments - 05/30/16 1506      Rate Your Plate Scores   Pre Score 77   Pre Score % 85 %      Education Questionnaire Score:     Knowledge Questionnaire Score - 05/30/16 1507      Knowledge Questionnaire Score   Pre Score 24/28      Goals reviewed with patient; copy given to patient.

## 2016-08-21 NOTE — Telephone Encounter (Signed)
Ree KidaJack still recovering from being sick.  He is not feeling up to getting back to rehab especially with the holiday season.  He may try to come back in the new year.  I encouraged him to continue to walk on his own and to just let us or his doctor know when he is ready to return. Fabio PierceJessica Doss Cybulski, MA, ACSM RCEP 08/21/2016 2:27 PM]

## 2016-08-21 NOTE — Progress Notes (Signed)
Cardiac Individual Treatment Plan  Patient Details  Name: Eugene Woods MRN: [id number] Date of Birth: Mar 19, 1971 Referring Provider:   Flowsheet Row Cardiac Rehab from 05/30/2016 in [facility] Cardiac and Pulmonary Rehab  Referring Provider  [name]      Initial Encounter Date:  Flowsheet Row Cardiac Rehab from 05/30/2016 in [facility] Cardiac and Pulmonary Rehab  Date  05/30/16  Referring Provider  [name]      Visit Diagnosis: ST elevation myocardial infarction (STEMI), unspecified artery (HCC)  Patient's Home Medications on Admission:  Current Outpatient Prescriptions:  .  aspirin 81 MG chewable tablet, Chew 81 mg by mouth., Disp: , Rfl:  .  atorvastatin (LIPITOR) 80 MG tablet, Take 80 mg by mouth., Disp: , Rfl:  .  furosemide (LASIX) 20 MG tablet, Take 20 mg by mouth., Disp: , Rfl:  .  isosorbide mononitrate (IMDUR) 60 MG 24 hr tablet, Take 60 mg by mouth., Disp: , Rfl:  .  lisinopril (PRINIVIL,ZESTRIL) 20 MG tablet, Take 20 mg by mouth., Disp: , Rfl:  .  metoprolol succinate (TOPROL-XL) 25 MG 24 hr tablet, Take 25 mg by mouth., Disp: , Rfl:  .  nitroGLYCERIN (NITROSTAT) 0.4 MG SL tablet, Place 0.4 mg under the tongue., Disp: , Rfl:  .  prasugrel (EFFIENT) 10 MG TABS tablet, Take 10 mg by mouth., Disp: , Rfl:   Past Medical History: No past medical history on file.  Tobacco Use: History  Smoking Status  . Not on file  Smokeless Tobacco  . Not on file    Labs: Recent Review Flowsheet Data    There is no flowsheet data to display.       Exercise Target Goals:    Exercise Program Goal: Individual exercise prescription set with THRR, safety & activity barriers. Participant demonstrates ability to understand and report RPE using BORG scale, to self-measure pulse accurately, and to acknowledge the importance of the exercise prescription.  Exercise Prescription Goal: Starting with aerobic activity 30 plus minutes a day, 3 days per week for initial exercise prescription.  Provide home exercise prescription and guidelines that participant acknowledges understanding prior to discharge.  Activity Barriers & Risk Stratification:     Activity Barriers & Cardiac Risk Stratification - 05/30/16 1506      Activity Barriers & Cardiac Risk Stratification   Activity Barriers Joint Problems  History of right shoulder surgery   Cardiac Risk Stratification High      6 Minute Walk:     6 Minute Walk    Row Name 05/30/16 1454         6 Minute Walk   Distance 1310 feet     Walk Time 6 minutes     MPH 2.48     METS 3.23     RPE 13     VO2 Peak 11.3     Symptoms No     Resting HR 61 bpm     Resting BP 106/62     Max Ex. HR 94 bpm     Max Ex. BP 126/68        Initial Exercise Prescription:     Initial Exercise Prescription - 05/30/16 1400      Date of Initial Exercise RX and Referring Provider   Date 05/30/16   Referring Provider [name]     Treadmill   MPH 2.5   Grade 0   Minutes 15   METs 2.91     Recumbant Bike   Level 2   Minutes 15  METs 2.9     REL-XR   Level 3   Minutes 15   METs 2.9     T5 Nustep   Level 3   Minutes 15   METs 2.9     Prescription Details   Frequency (times per week) 2   Duration Progress to 45 minutes of aerobic exercise without signs/symptoms of physical distress     Intensity   THRR 40-80% of Max Heartrate 106-151   Ratings of Perceived Exertion 11-13     Progression   Progression Continue to progress workloads to maintain intensity without signs/symptoms of physical distress.     Resistance Training   Training Prescription Yes   Weight 3   Reps 10-15      Perform Capillary Blood Glucose checks as needed.  Exercise Prescription Changes:     Exercise Prescription Changes    Row Name 05/30/16 1400 06/11/16 1000 06/11/16 1600 06/26/16 0900 07/10/16 1500     Exercise Review   Progression  -  - Yes Yes Yes     Response to Exercise   Blood Pressure (Admit) 106/62  - 136/72 144/84 118/70    Blood Pressure (Exercise) 126/68  - 148/70 166/80 152/74   Blood Pressure (Exit) 102/32  - 120/70 126/60 104/74   Heart Rate (Admit) 72 bpm  - 92 bpm 79 bpm 61 bpm   Heart Rate (Exercise) 94 bpm  - 117 bpm 131 bpm 116 bpm   Heart Rate (Exit) 71 bpm  - 88 bpm 77 bpm 49 bpm   Rating of Perceived Exertion (Exercise) 13  - 15 16 14    Symptoms  -  - none none none   Comments  - Home Exercise Guidelines given 06/11/16 Home Exercise Guidelines given 06/11/16 Home Exercise Guidelines given 06/11/16 Home Exercise Guidelines given 06/11/16   Duration  -  - Progress to 45 minutes of aerobic exercise without signs/symptoms of physical distress Progress to 45 minutes of aerobic exercise without signs/symptoms of physical distress Progress to 45 minutes of aerobic exercise without signs/symptoms of physical distress   Intensity  -  - THRR unchanged THRR unchanged THRR unchanged     Progression   Progression  -  - Continue to progress workloads to maintain intensity without signs/symptoms of physical distress. Continue to progress workloads to maintain intensity without signs/symptoms of physical distress. Continue to progress workloads to maintain intensity without signs/symptoms of physical distress.   Average METs  -  - 3.6 4.26 4.36     Resistance Training   Training Prescription  -  - Yes Yes Yes   Weight  -  - 3 lbs 3 lbs 4 lbs   Reps  -  - 10-15 10-15 10-15     Interval Training   Interval Training  -  - No Yes Yes   Equipment  -  -  - Treadmill;REL-XR;Elliptical Treadmill;REL-XR;Elliptical   Comments  -  -  - 2 min off 30 sec on 2 min off 30 sec on     Treadmill   MPH  -  - 2.5 3.5 3.5   Grade  -  - 0 0 0   Minutes  -  - 15 15 15    METs  -  - 2.91 3.68 3.68     Elliptical   Level  -  - 1 1 1    Speed  -  - 2.5 2.5 2.5   Minutes  -  - 15 15 15    METs  -  -  6 6.5 6.5     REL-XR   Level  -  - 3 3 3    Minutes  -  - 15 15 15    METs  -  - 1.9 2.6 2.9     Home Exercise Plan   Plans to continue  exercise at  - Home  walking Home  walking Home  walking Home  walking   Frequency  - Add 4 additional days to program exercise sessions. Add 4 additional days to program exercise sessions. Add 4 additional days to program exercise sessions. Add 4 additional days to program exercise sessions.      Exercise Comments:     Exercise Comments    Row Name 06/04/16 1610 06/11/16 1028 06/11/16 1614 06/26/16 0944 07/10/16 1540   Exercise Comments First full day of exercise!  Patient was oriented to gym and equipment including functions, settings, policies, and procedures.  Patient's individual exercise prescription and treatment plan were reviewed.  All starting workloads were established based on the results of the 6 minute walk test done at initial orientation visit.  The plan for exercise progression was also introduced and progression will be customized based on patient's performance and goals.We did need to reduce his workload on the XR due to constant chest pain (3/10).  We will continue to monitor pt for chest pain.  We discussed our guidelines for chest pain management and the importance of letting staff know what it going on. Reviewed home exercise with pt today.  Pt plans to walk at home for exercise.  Reviewed THR, pulse, RPE, sign and symptoms, NTG use, and when to call 911 or MD.  Also discussed weather considerations and indoor options.  Pt voiced understanding. Eugene Woods is off to a good start in rehab.  He has already started to learn Woods and starting to increase his activity levels.  We will continue to monitor for progression. Eugene Woods has been doing well with exercise.  He is up 2.6 METs on the XR.  He is doing intervals on all the equipment.  We will continue to monitor for progression. Eugene Woods continues to do well with exercise.  He is up to 2.9 METs on the XR.  He has been enjoying interval training.  We will continue to monitor for progression.   Row Name 07/23/16 1435 08/06/16 1541          Exercise Comments Eugene Woods has been out sick since last review. Eugene Woods has continued to be out.         Discharge Exercise Prescription (Final Exercise Prescription Changes):     Exercise Prescription Changes - 07/10/16 1500      Exercise Review   Progression Yes     Response to Exercise   Blood Pressure (Admit) 118/70   Blood Pressure (Exercise) 152/74   Blood Pressure (Exit) 104/74   Heart Rate (Admit) 61 bpm   Heart Rate (Exercise) 116 bpm   Heart Rate (Exit) 49 bpm   Rating of Perceived Exertion (Exercise) 14   Symptoms none   Comments Home Exercise Guidelines given 06/11/16   Duration Progress to 45 minutes of aerobic exercise without signs/symptoms of physical distress   Intensity THRR unchanged     Progression   Progression Continue to progress workloads to maintain intensity without signs/symptoms of physical distress.   Average METs 4.36     Resistance Training   Training Prescription Yes   Weight 4 lbs   Reps 10-15     Interval Training  Interval Training Yes   Equipment Treadmill;REL-XR;Elliptical   Comments 2 min off 30 sec on     Treadmill   MPH 3.5   Grade 0   Minutes 15   METs 3.68     Elliptical   Level 1   Speed 2.5   Minutes 15   METs 6.5     REL-XR   Level 3   Minutes 15   METs 2.9     Home Exercise Plan   Plans to continue exercise at Home  walking   Frequency Add 4 additional days to program exercise sessions.      Nutrition:  Target Goals: Understanding of nutrition guidelines, daily intake of sodium 1500mg , cholesterol 200mg , calories 30% from fat and 7% or less from saturated fats, daily to have 5 or Woods servings of fruits and vegetables.  Biometrics:     Pre Biometrics - 05/30/16 1453      Pre Biometrics   Height 6' (1.829 m)   Weight (!)  310 lb 4.8 oz (140.8 kg)   Waist Circumference 51.75 inches   Hip Circumference 53.25 inches   Waist to Hip Ratio 0.97 %   BMI (Calculated) 42.2   Single Leg Stand 30 seconds        Nutrition Therapy Plan and Nutrition Goals:     Nutrition Therapy & Goals - 07/02/16 1248      Nutrition Therapy   Diet Instructed patient on heart healthy dietary guidelines with a calorie range of 1800-2000 calories, including DASH diet principles   Drug/Food Interactions Statins/Certain Fruits   Protein (specify units) 8   Fiber 30 grams   Whole Grain Foods 3 servings   Saturated Fats 12 max. grams   Fruits and Vegetables 5 servings/day   Sodium 1500 grams     Personal Nutrition Goals   Personal Goal #1 use myfitnesspal app or Lose it app to record food/beverage intake   Personal Goal #2 Team with brother to download food record into his phone for Woods accountability   Personal Goal #3 Look for saturated fat, trans fat and sodium.   Personal Goal #4 Also, balance meals with protein, 3-4 servings of carbohydrate and be generous with non-starchy vegetable portions.     Intervention Plan   Intervention Prescribe, educate and counsel regarding individualized specific dietary modifications aiming towards targeted core components such as weight, hypertension, lipid management, diabetes, heart failure and other comorbidities.;Nutrition handout(s) given to patient.   Expected Outcomes Short Term Goal: Understand basic principles of dietary content, such as calories, fat, sodium, cholesterol and nutrients.;Short Term Goal: A plan has been developed with personal nutrition goals set during dietitian appointment.;Long Term Goal: Adherence to prescribed nutrition plan.      Nutrition Discharge: Rate Your Plate Scores:     Nutrition Assessments - 05/30/16 1506      Rate Your Plate Scores   Pre Score 77   Pre Score % 85 %      Nutrition Goals Re-Evaluation:   Psychosocial: Target Goals: Acknowledge presence or absence of depression, maximize coping skills, provide positive support system. Participant is able to verbalize types and ability to use techniques and skills needed for  reducing stress and depression.  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 Does report a supportive boss.      Family Dynamics   Good Support System? Yes  Mom and Dad supportive, live in [place], [place]   Comments Single parent of 7 children. Ages 70-26 4 at home.     Barriers   Psychosocial barriers to participate in program There are no identifiable barriers or psychosocial needs.;The patient should benefit from training in stress management and relaxation.     Screening Interventions   Interventions Encouraged to exercise      Quality of Life Scores:     Quality of Life - 05/30/16 1759      Quality of Life Scores   Health/Function Pre 20 %   Socioeconomic Pre 20.36 %   Psych/Spiritual Pre 20.36 %   Family Pre 26 %   GLOBAL Pre 20.91 %      PHQ-9: Recent Review Flowsheet Data    Depression screen [facility] 2/9 05/30/2016   Decreased Interest 2   Down, Depressed, Hopeless 0   PHQ - 2 Score 2   Altered sleeping 1   Tired, decreased energy 2   Change in appetite 0   Feeling bad or failure about yourself  0   Trouble concentrating 0   Moving slowly or fidgety/restless 0   Suicidal thoughts 0   PHQ-9 Score 5   Difficult doing work/chores Somewhat difficult       Psychosocial Evaluation and Intervention:   Psychosocial Re-Evaluation:   Vocational Rehabilitation: Provide vocational rehab assistance to qualifying candidates.   Vocational Rehab Evaluation & Intervention:     Vocational Rehab - 05/30/16 1507      Initial Vocational Rehab Evaluation & Intervention   Assessment shows need for Vocational Rehabilitation No      Education: Education Goals: Education classes will be provided on a weekly basis,  covering required topics. Participant will state understanding/return demonstration of topics presented.  Learning Barriers/Preferences:     Learning Barriers/Preferences - 05/30/16 1507      Learning Barriers/Preferences   Learning Barriers None   Learning Preferences None      Education Topics: General Nutrition Guidelines/Fats and Fiber: -Group instruction provided by verbal, written material, models and posters to present the general guidelines for heart healthy nutrition. Gives an explanation and review of dietary fats and fiber.   Controlling Sodium/Reading Food Labels: -Group verbal and written material supporting the discussion of sodium use in heart healthy nutrition. Review and explanation with models, verbal and written materials for utilization of the food label. Flowsheet Row Cardiac Rehab from 07/02/2016 in [facility] Cardiac and Pulmonary Rehab  Date  07/02/16  Educator  PI  Instruction Review Code  2- meets goals/outcomes      Exercise Physiology & Risk Factors: - Group verbal and written instruction with models to review the exercise physiology of the cardiovascular system and associated critical values. Details cardiovascular disease risk factors and the goals associated with each risk factor.   Aerobic Exercise & Resistance Training: - Gives group verbal and written discussion on the health impact of inactivity. On the components of aerobic and resistive training programs and the benefits of this training and how to safely progress through these programs.   Flexibility, Balance, General Exercise Guidelines: - Provides group verbal and written instruction on the benefits of flexibility and balance training programs. Provides general exercise guidelines with specific guidelines to those with heart or lung disease. Demonstration and skill practice provided.   Stress Management: - Provides group verbal  and written instruction about the health risks of elevated stress,  cause of high stress, and healthy ways to reduce stress.   Depression: - Provides group verbal and written instruction on the correlation between heart/lung disease and depressed mood, treatment options, and the stigmas associated with seeking treatment.   Anatomy & Physiology of the Heart: - Group verbal and written instruction and models provide basic cardiac anatomy and physiology, with the coronary electrical and arterial systems. Review of: AMI, Angina, Valve disease, Heart Failure, Cardiac Arrhythmia, Pacemakers, and the ICD.   Cardiac Procedures: - Group verbal and written instruction and models to describe the testing methods done to diagnose heart disease. Reviews the outcomes of the test results. Describes the treatment choices: Medical Management, Angioplasty, or Coronary Bypass Surgery. Flowsheet Row [facility]  Date  06/04/16  Educator  CE  Instruction Review Code  2- meets goals/outcomes      Cardiac Medications: - Group verbal and written instruction to review commonly prescribed medications for heart disease. Reviews the medication, class of the drug, and side effects. Includes the steps to properly store meds and maintain the prescription regimen. Flowsheet Row [facility]  Date  06/13/16  Educator  CE  Instruction Review Code  1- partially meets, needs review/practice [part 2]      Go Sex-Intimacy & Heart Disease, Get SMART - Goal Setting: - Group verbal and written instruction through game format to discuss heart disease and the return to sexual intimacy. Provides group verbal and written material to discuss and apply goal setting through the application of the S.M.A.R.T. Method. Flowsheet Row [facility]  Date  06/04/16  Educator  CE  Instruction Review Code  2- meets goals/outcomes      Other Matters  of the Heart: - Provides group verbal, written materials and models to describe Heart Failure, Angina, Valve Disease, and Diabetes in the realm of heart disease. Includes description of the disease process and treatment options available to the cardiac patient.   Exercise & Equipment Safety: - Individual verbal instruction and demonstration of equipment use and safety with use of the equipment. Flowsheet Row Card[facility]  Date  05/30/16  Educator  SB  Instruction Review Code  2- meets goals/outcomes      Infection Prevention: - Provides verbal and written material to individual with discussion of infection control including proper hand washing and proper equipment cleaning during exercise session. Flowsheet [facility]  Date  05/30/16  Educator  SB  Instruction Review Code  2- meets goals/outcomes      Falls Prevention: - Provides verbal and written material to individual with discussion of falls prevention and safety.   Diabetes: - Individual verbal and written instruction to review signs/symptoms of diabetes, desired ranges of glucose level fasting, after meals and with exercise. Advice that pre and post exercise glucose checks will be done for 3 sessions at entry of program.    Knowledge Questionnaire Score:     Knowledge Questionnaire Score - 05/30/16 1507      Knowledge Questionnaire Score   Pre Score 24/28      Core Components/Risk Factors/Patient Goals at Admission:     Personal Goals and Risk Factors at Admission - 05/30/16 1300      Core Components/Risk Factors/Patient  Goals on Admission    Weight Management Yes;Obesity   Intervention Weight Management: Develop a combined nutrition and exercise program designed to reach desired caloric intake, while maintaining appropriate intake of nutrient and fiber, sodium and fats, and appropriate energy expenditure  required for the weight goal.;Weight Management: Provide education and appropriate resources to help participant work on and attain dietary goals.   Admit Weight 308 lb (139.7 kg)   Goal Weight: Short Term 299 lb (135.6 kg)   Goal Weight: Long Term 220 lb (99.8 kg)   Expected Outcomes Weight Loss: Understanding of general recommendations for a balanced deficit meal plan, which promotes 1-2 lb weight loss per week and includes a negative energy balance of [phone] kcal/d   Sedentary Yes   Intervention Provide advice, education, support and counseling about physical activity/exercise needs.;Develop an individualized exercise prescription for aerobic and resistive training based on initial evaluation findings, risk stratification, comorbidities and participant's personal goals.   Expected Outcomes Achievement of increased cardiorespiratory fitness and enhanced flexibility, muscular endurance and strength shown through measurements of functional capacity and personal statement of participant.   Increase Strength and Stamina Yes   Intervention Provide advice, education, support and counseling about physical activity/exercise needs.;Develop an individualized exercise prescription for aerobic and resistive training based on initial evaluation findings, risk stratification, comorbidities and participant's personal goals.   Expected Outcomes Achievement of increased cardiorespiratory fitness and enhanced flexibility, muscular endurance and strength shown through measurements of functional capacity and personal statement of participant.   Hypertension Yes   Intervention Provide education on lifestyle modifcations including regular physical activity/exercise, weight management, moderate sodium restriction and increased consumption of fresh fruit, vegetables, and low fat dairy, alcohol moderation, and smoking cessation.;Monitor prescription use compliance.   Expected Outcomes Short Term: Continued assessment and  intervention until BP is < 140/20mm HG in hypertensive participants. < 130/76mm HG in hypertensive participants with diabetes, heart failure or chronic kidney disease.;Long Term: Maintenance of blood pressure at goal levels.   Lipids Yes   Intervention Provide education and support for participant on nutrition & aerobic/resistive exercise along with prescribed medications to achieve LDL 70mg , HDL >40mg .   Expected Outcomes Short Term: Participant states understanding of desired cholesterol values and is compliant with medications prescribed. Participant is following exercise prescription and nutrition guidelines.;Long Term: Cholesterol controlled with medications as prescribed, with individualized exercise RX and with personalized nutrition plan. Value goals: LDL < 70mg , HDL > 40 mg.   Stress Yes   Intervention Offer individual and/or small group education and counseling on adjustment to heart disease, stress management and health-related lifestyle change. Teach and support self-help strategies.;Refer participants experiencing significant psychosocial distress to appropriate mental health specialists for further evaluation and treatment. When possible, include family members and significant others in education/counseling sessions.   Expected Outcomes Short Term: Participant demonstrates changes in health-related behavior, relaxation and other stress management skills, ability to obtain effective social support, and compliance with psychotropic medications if prescribed.;Long Term: Emotional wellbeing is indicated by absence of clinically significant psychosocial distress or social isolation.      Core Components/Risk Factors/Patient Goals Review:      Goals and Risk Factor Review    Row Name 06/11/16 1020             Core Components/Risk Factors/Patient Goals Review   Personal Goals Review Weight Management/Obesity;Sedentary;Increase Strength and Stamina;Heart Failure;Hypertension;Lipids;Stress        Review Eugene Woods is off to a good start in rehab.  His weight was up again  today.  He was noticing increased swelling.  He was encouraged to take an extra lasix and that if it does not help or his weight goes up any Woods this week to call his doctor.  We discussed the importance of weighing daily and watching salt intake to monitor his heart failure symptoms.  He is eating healthy and watching portions, so we also talked about moving Woods frequently.  He did mention SOB at home while resting.  We discussed how this plays into his heart failure and weight management.  His blood pressures have been good, but he has not been taking it at home.  He has not had any problems with his statins.  His stress levels are steady.  We talked about how exercise could be another outlet for him.       Expected Outcomes Eugene Woods will continue to come to education and exercise class to work on weight loss, stress management, and increasing stamina.  We will also continue to monitor his weight and blood pressures and offer support where we can.          Core Components/Risk Factors/Patient Goals at Discharge (Final Review):      Goals and Risk Factor Review - 06/11/16 1020      Core Components/Risk Factors/Patient Goals Review   Personal Goals Review Weight Management/Obesity;Sedentary;Increase Strength and Stamina;Heart Failure;Hypertension;Lipids;Stress   Review Eugene Woods is off to a good start in rehab.  His weight was up again today.  He was noticing increased swelling.  He was encouraged to take an extra lasix and that if it does not help or his weight goes up any Woods this week to call his doctor.  We discussed the importance of weighing daily and watching salt intake to monitor his heart failure symptoms.  He is eating healthy and watching portions, so we also talked about moving Woods frequently.  He did mention SOB at home while resting.  We discussed how this plays into his heart failure and weight management.  His blood  pressures have been good, but he has not been taking it at home.  He has not had any problems with his statins.  His stress levels are steady.  We talked about how exercise could be another outlet for him.   Expected Outcomes Eugene Woods will continue to come to education and exercise class to work on weight loss, stress management, and increasing stamina.  We will also continue to monitor his weight and blood pressures and offer support where we can.      ITP Comments:     ITP Comments    Row Name 05/30/16 1514 06/19/16 [id number] 07/17/16 0626 07/23/16 1434 08/06/16 1541   ITP Comments Initial ITP created today during medical review.  Documentation of admitting diagnosis can be found [facility] Encounter on 05/06/2016 30 day review. Continue with ITP unless changes noted by Medical Director at signature of review. 30 day review. Continue with ITP unless changes noted by Medical Director at signature of review.  Eugene Woods has been out with tooth infection and a cold.  He hopes to return Thursday. Called to check on status.  [facility]   Row Name 08/14/16 [id number] 08/21/16 1427         ITP Comments 30 day review completed for Medical Director physician review and signature. Continue ITP unless changes made by physician. last visit 07/04/2016 Eugene Woods still recovering from being sick.  He is not feeling up to getting back to rehab especially with the holiday  season.  He may try to come back in the new year.  I encouraged him to continue to walk on his own and to just let us or his doctor know when he is ready to return.         Comments: Discharge ITP

## 2016-09-10 ENCOUNTER — Encounter: Payer: 59 | Attending: Internal Medicine

## 2016-09-10 DIAGNOSIS — I213 ST elevation (STEMI) myocardial infarction of unspecified site: Secondary | ICD-10-CM | POA: Insufficient documentation

## 2016-10-07 DIAGNOSIS — I214 Non-ST elevation (NSTEMI) myocardial infarction: Secondary | ICD-10-CM

## 2016-10-07 HISTORY — DX: Non-ST elevation (NSTEMI) myocardial infarction: I21.4

## 2017-06-07 HISTORY — PX: CARDIAC CATHETERIZATION: SHX172

## 2017-11-06 ENCOUNTER — Telehealth: Payer: Self-pay | Admitting: Cardiovascular Disease

## 2017-11-06 NOTE — Telephone Encounter (Signed)
Lmov for patient to call back, change in schedule need to change time of appt on 11/18/17  Will try again at a later time

## 2017-11-17 DIAGNOSIS — I2510 Atherosclerotic heart disease of native coronary artery without angina pectoris: Secondary | ICD-10-CM | POA: Insufficient documentation

## 2017-11-17 DIAGNOSIS — E785 Hyperlipidemia, unspecified: Secondary | ICD-10-CM | POA: Insufficient documentation

## 2017-11-17 NOTE — Progress Notes (Deleted)
Cardiology Office Note  Date:  11/17/2017   ID:  [name], DOB Aug 30, 1971, MRN [id number]  PCP:  No primary care provider on file.   No chief complaint on file.   HPI:   Morbid obesity Hyperlipidemia Diabetes CAD, previous MI Chronic systolic and diastolic CHF Smoker VF arrest 82/95/621306/11/2015, defibrillated, intubated Occluded mid LAD with stent placed ejection fraction 35-40% Had repeat catheterization for positive PET stress test July 2017 FFR positive LAD lesion distal stent placed [facility] Recurrent chest pain with repeat PET stress apical defect noted managed medically Echocardiogram August 2017 ejection fraction 50% PET stress test apical, septal apical anterior apical segments hypokinetic ejection fraction 65%. Felt to have possible ischemia Chronic hand discomfort Pulmonary embolism Previously [facility] cardiology [facility]   PMH:   has no past medical history on file.  PSH:   *** The histories are not reviewed yet. Please review them in the "History" navigator section and refresh this SmartLink.  Current Outpatient Medications  Medication Sig Dispense Refill  . atorvastatin (LIPITOR) 80 MG tablet Take 80 mg by mouth.    . furosemide (LASIX) 20 MG tablet Take 20 mg by mouth.    . isosorbide mononitrate (IMDUR) 60 MG 24 hr tablet Take 60 mg by mouth.    Marland Kitchen. lisinopril (PRINIVIL,ZESTRIL) 20 MG tablet Take 20 mg by mouth.    . metoprolol succinate (TOPROL-XL) 25 MG 24 hr tablet Take 25 mg by mouth.    . nitroGLYCERIN (NITROSTAT) 0.4 MG SL tablet Place 0.4 mg under the tongue.     No current facility-administered medications for this visit.      Allergies:   Patient has no allergy information on record.   Social History:  The patient     Family History:   family history is not on file.    Review of Systems: ROS   PHYSICAL EXAM: VS:  There were no vitals taken for this visit. , BMI There is no height or weight on file to  calculate BMI. GEN: Well nourished, well developed, in no acute distress  HEENT: normal  Neck: no JVD, carotid bruits, or masses Cardiac: RRR; no murmurs, rubs, or gallops,no edema  Respiratory:  clear to auscultation bilaterally, normal work of breathing GI: soft, nontender, nondistended, + BS MS: no deformity or atrophy  Skin: warm and dry, no rash Neuro:  Strength and sensation are intact Psych: euthymic mood, full affect    Recent Labs: No results found for requested labs within last 8760 hours.    Lipid Panel No results found for: CHOL, HDL, LDLCALC, TRIG    Wt Readings from Last 3 Encounters:  05/30/16 (!) 310 lb 4.8 oz (140.8 kg)       ASSESSMENT AND PLAN:  No diagnosis found.   Disposition:   F/U  6 months  No orders of the defined types were placed in this encounter.    Signed, Dossie Arbourim Ashten Sarnowski, M.D., Ph.D. 11/17/2017  [facility], [place] [phone]

## 2017-11-18 ENCOUNTER — Ambulatory Visit: Payer: 59 | Admitting: Cardiovascular Disease

## 2017-12-23 NOTE — Progress Notes (Signed)
Cardiology Office Note  Date:  12/25/2017   ID:  Eugene Woods, DOB [birth year], MRN [id number]  PCP:  [name], MD   Chief Complaint  Patient presents with  . other    Establish care for history of CAD; s/p stent placement x 3 with STEMI & NSTEMI, was being followed at [facility]. Meds reviewed by the pt. verbally. Pt. c/o chest pain, shortness of breath, chest burning and feels fatigue all the time.     HPI:  47 yo male with hx of Smoking Coronary artery disease CAD s/p STEMI c/b VF arrest w/ PCI to proximal LAD. 03/2016 Subsequent stent to distal LAD (FFR guided in the context of symptoms) -04/2016 Finally stent to the D1 in September 2018 Continues to have chronic chest pain symptoms Presented back to the hospital January 2019 had normal stress test treadmill alone And is normal echo Hyperlipidemia pulmonary embolism 06/29/2014 treated with anticoagulation/eliqui Presenting by referral from [facility] for consultation of his chest pain symptoms  Long discussion concerning his prior history Details as below  vague chest pain presentation on 03/08/16. hyperacute T waves and subsequently went into VF arrest x2.   defibrillated and intubation coronary angiography which revealed a mid LAD occlusion PCI EF was 35-40%.    recurring, but less intense CP.   sent back to the hospital were he was observed   return to the hospital and underwent a PET stress test in July which revealed a reversible apical defect. repeat coronary angiogram and FFR was LAD was performed which was positive at 0.77. His distal LAD was subsequently stented.    returned to [facility] on 8/1 with recurrent chest pain.  PET stress test which had an unchanged apical defect which was managed medically.   Pertinent Test Results: ECG: 05/28/16 Normal sinus rhythm, septal infarct noted. Unchanged from previous.   Echocardiograms 05/08/16: Mildly decreased left ventricular systolic function, ejection fraction 45 to  50%, Segmental wall motion abnormality - (anterior and apical) 03/22/2016: Mildly decreased left ventricular systolic function, ejection fraction 45 to 50%, Segmental wall motion abnormality - (anterior and apical) 03/09/2016 Moderately decreased left ventricular systolic function, ejection fraction 40%, Segmental wall motion abnormality - (apical anterior, apical septal, mid anteroseptal and mid anterior)  Left Heart Catheterizations  03/08/2016: Findings: 1. Anterior STEMI with thrombotic occlusion of the mid LAD. 60% ostial D1 stenosis is also present. 2. Severely elevated left ventricular filling pressures (LVEPD = 43 mm Hg). 3. Successful PCI to the proximal/mid LAD with successful placement of a Xience Alpine drug-eluting stent.  04/30/16  LV Gram: Anterior wall hypokinesis with mild reduction in LV systolic function, Patent Stent,  Coronary artery disease including 60% diagonal stenosis, 20% proximal RCA stenosis and 50% distal LAD stenosis (FFR = 0.77) PCI of distal LADwith placement of a Resolute drug eluting stent  Nuclear Cardiology   05/07/16 PET - There is a moderate in size, mild in severity, completely reversible defect involving the apical, apical septal, apical anterior and mid anteroseptal segments. Normal EF.  Unchanged from 04/30/2016.  Most recent stent September 2018 to the D1 Resolute 2.5 x 12 mm onyx  Stress test 10/2017, no ischemia Echo  Tried PPI, stop the PPI as it was not helping   Chronic pain even at rest Fatigue with exertion,  Previously did cardiac rehab 2017, unable to participate anymore secondary to financial reasons  On and off imdur Back on imdur 11/2017 Feels it is helping somewhat  Symptoms have been severe the  past several days Reports having shortness of breath with slight exertion Tingling and weak feeling in both forearms and hands Tightness left front neck Just tired in general with lack of energy Bruised feeling left chest shoulder area  under left breast Vision gets unfocused at times Thin stools, constipation relieved with MiraLAX Shooting pains right side groin he feels is from small hernia Very occasional heavy sweats with burning left chest spreading down left arm  EKG personally reviewed by myself on todays visit Shows normal sinus rhythm rate 80 bpm no significant ST or T wave changes  PMH:   has a past medical history of Coronary artery disease, Diabetes mellitus without complication (HCC), GERD (gastroesophageal reflux disease), Hyperlipidemia, Hypertension, MI (myocardial infarction) (HCC), NSTEMI (non-ST elevated myocardial infarction) (HCC) (2018), Pulmonary embolism (HCC), and STEMI (ST elevation myocardial infarction) (HCC) (2017).  PSH:    Past Surgical History:  Procedure Laterality Date  . CARDIAC CATHETERIZATION  06/2017   PCI to DIAG 1  . CORONARY ANGIOPLASTY WITH STENT PLACEMENT  04/2016   Repeat PCI to the distal LAD   . CORONARY ANGIOPLASTY WITH STENT PLACEMENT  03/2016   PCI to the Proximal/mid LAD with stent placement of a Xience Alpine drug eluting stent.      Current Outpatient Medications  Medication Sig Dispense Refill  . aspirin 81 MG tablet Take 81 mg by mouth daily.    Marland Kitchen atorvastatin (LIPITOR) 80 MG tablet Take 80 mg by mouth daily at 6 PM.     . clopidogrel (PLAVIX) 75 MG tablet Take 75 mg by mouth daily.    . furosemide (LASIX) 20 MG tablet Take 20 mg by mouth daily as needed.     . isosorbide mononitrate (IMDUR) 60 MG 24 hr tablet Take 60 mg by mouth daily.     Marland Kitchen lisinopril (PRINIVIL,ZESTRIL) 20 MG tablet Take 20 mg by mouth daily.     . metFORMIN (GLUCOPHAGE) 1000 MG tablet Take 1,000 mg by mouth daily with breakfast.    . metoprolol succinate (TOPROL-XL) 25 MG 24 hr tablet Take 25 mg by mouth daily.     . nitroGLYCERIN (NITROSTAT) 0.4 MG SL tablet Place 0.4 mg under the tongue.    . ranolazine (RANEXA) 500 MG 12 hr tablet Take 500 mg by mouth every 12 (twelve) hours.     No  current facility-administered medications for this visit.      Allergies:   Patient has no known allergies.   Social History:  The patient  reports that he quit smoking about 21 months ago. His smoking use included cigarettes. He has a 13.50 pack-year smoking history. He has quit using smokeless tobacco. He reports that he drinks about 1.2 oz of alcohol per week. He reports that he does not use drugs.   Family History:   family history includes Diabetes in his father; HIV in his brother; Heart attack (age of onset: 23) in his father; Heart disease in his brother, father, and mother; Hyperlipidemia in his brother, father, and mother; Hypertension in his brother, father, and mother; Valvular heart disease in his mother.    Review of Systems: Review of Systems  Constitutional: Negative.   Respiratory: Negative.   Cardiovascular: Positive for chest pain.       Episodes of sweating/diaphoresis  Gastrointestinal: Negative.   Musculoskeletal: Negative.   Neurological: Negative.   Psychiatric/Behavioral: Negative.   All other systems reviewed and are negative.    PHYSICAL EXAM: VS:  BP (!) 130/100 (BP Location: Right  Arm, Patient Position: Sitting, Cuff Size: Large)   Pulse 80   Ht 6' (1.829 m)   Wt (!) 326 lb (147.9 kg)   BMI 44.21 kg/m  , BMI Body mass index is 44.21 kg/m. GEN: Well nourished, well developed, in no acute distress  HEENT: normal  Neck: no JVD, carotid bruits, or masses Cardiac: RRR; no murmurs, rubs, or gallops,no edema  Respiratory:  clear to auscultation bilaterally, normal work of breathing GI: soft, nontender, nondistended, + BS MS: no deformity or atrophy  Skin: warm and dry, no rash Neuro:  Strength and sensation are intact Psych: euthymic mood, full affect    Recent Labs: No results found for requested labs within last 8760 hours.    Lipid Panel No results found for: CHOL, HDL, LDLCALC, TRIG    Wt Readings from Last 3 Encounters:  12/25/17 (!)  326 lb (147.9 kg)  05/30/16 (!) 310 lb 4.8 oz (140.8 kg)       ASSESSMENT AND PLAN:  Atherosclerosis of native coronary artery of native heart with stable angina pectoris (HCC) - Plan: EKG 12-Lead Continue to have chronic chest pain symptoms worse in the past several days We have reviewed his history with him Pain has persisted despite recent stent placed September 2018 Normal stress test [facility] He is requesting troponin We have recommended he increase isosorbide up to 60 twice daily Recommended he increase Ranexa up to 1000 twice daily, coupon provided Next medication change would be to increase metoprolol up to 50 We have recommend he change his Plavix to brilinta , coupon provided,   Cardiac arrest [facility](HCC) Previous records reviewed from 2017 Stent placed to proximal LAD, distal LAD, now diagonal vessel #1 Chronic pain syndrome Does not want cardiac rehab Will try medication management as detailed above  Mixed hyperlipidemia We have drawn lipid panel today, goal LDL less than 70  Essential hypertension Blood pressure high, changes as above Would like to increase metoprolol succinate up to 50 daily but will wait until next visit  Smoker We have encouraged him to continue to work on weaning his cigarettes and smoking cessation. He will continue to work on this and does not want any assistance with chantix.   Other chronic pulmonary embolism without acute cor pulmonale (HCC) Prior history of PE, treated with Eliquis  Disposition:   F/U  6 months   Total encounter time more than 60 minutes  Greater than 50% was spent in counseling and coordination of care with the patient    Orders Placed This Encounter  Procedures  . EKG 12-Lead     Signed, Dossie Arbourim Srihitha Tagliaferri, M.D., Ph.D. 12/25/2017  [facility], [place] [phone]

## 2017-12-25 ENCOUNTER — Ambulatory Visit: Payer: 59 | Admitting: Cardiovascular Disease

## 2017-12-25 ENCOUNTER — Encounter: Payer: Self-pay | Admitting: Cardiovascular Disease

## 2017-12-25 VITALS — BP 130/100 | HR 80 | Ht 72.0 in | Wt 326.0 lb

## 2017-12-25 DIAGNOSIS — I25118 Atherosclerotic heart disease of native coronary artery with other forms of angina pectoris: Secondary | ICD-10-CM | POA: Diagnosis not present

## 2017-12-25 DIAGNOSIS — I2782 Chronic pulmonary embolism: Secondary | ICD-10-CM | POA: Diagnosis not present

## 2017-12-25 DIAGNOSIS — G4733 Obstructive sleep apnea (adult) (pediatric): Secondary | ICD-10-CM | POA: Diagnosis not present

## 2017-12-25 DIAGNOSIS — Z955 Presence of coronary angioplasty implant and graft: Secondary | ICD-10-CM | POA: Diagnosis not present

## 2017-12-25 DIAGNOSIS — I469 Cardiac arrest, cause unspecified: Secondary | ICD-10-CM | POA: Diagnosis not present

## 2017-12-25 DIAGNOSIS — Z720 Tobacco use: Secondary | ICD-10-CM

## 2017-12-25 DIAGNOSIS — I10 Essential (primary) hypertension: Secondary | ICD-10-CM | POA: Diagnosis not present

## 2017-12-25 DIAGNOSIS — E782 Mixed hyperlipidemia: Secondary | ICD-10-CM | POA: Diagnosis not present

## 2017-12-25 DIAGNOSIS — Z8674 Personal history of sudden cardiac arrest: Secondary | ICD-10-CM | POA: Diagnosis not present

## 2017-12-25 DIAGNOSIS — F17200 Nicotine dependence, unspecified, uncomplicated: Secondary | ICD-10-CM | POA: Diagnosis not present

## 2017-12-25 LAB — TROPONIN I

## 2017-12-25 MED ORDER — RANOLAZINE ER 1000 MG PO TB12: 1000.0000 mg | ORAL_TABLET | Freq: Two times a day (BID) | ORAL | 3 refills | Status: AC

## 2017-12-25 MED ORDER — ISOSORBIDE MONONITRATE ER 60 MG PO TB24: 60.0000 mg | ORAL_TABLET | Freq: Two times a day (BID) | ORAL | 3 refills | Status: AC

## 2017-12-25 MED ORDER — TICAGRELOR 90 MG PO TABS: 90.0000 mg | ORAL_TABLET | Freq: Two times a day (BID) | ORAL | 3 refills | Status: DC

## 2017-12-25 NOTE — Progress Notes (Signed)
[facility] calling troponin level of <0.01 and [facility].

## 2017-12-25 NOTE — Patient Instructions (Addendum)
Medication Instructions:  Your physician has recommended you make the following change in your medication:  1. INCREASE Ranexa to 1000 mg twice a day 2. INCREASE Isosorbide mononitrate 60 mg twice a day 3. START Brilinta 90 mg twice a day. Tomorrow 12/26/17 take 2 tablets in the AM and 1 tablet in the PM. This is a loading dose and after tomorrow you will just take 1 tablet twice a day. 4. STOP Plavix  Medication Samples have been provided to the patient.  Drug name: Brilinta       Strength: 90 mg        Qty: 2 bottles  LOT: [id number]  Exp.Date: 9/21  Medication Samples have been provided to the patient.  Drug name: Ranexa       Strength: 1000 mg        Qty: 2 cards  LOT: [id number]  Exp.Date: 08/20   Labwork:  Troponin and lipids We will call you with these results.   Testing/Procedures:  No further testing at this time   Follow-Up: It was a pleasure seeing you in the office today. Please call us if you have new issues that need to be addressed before your next appt.  [phone]  Your physician wants you to follow-up in: 6 months.  You will receive a reminder letter in the mail two months in advance. If you don't receive a letter, please call our office to schedule the follow-up appointment.  If you need a refill on your cardiac medications before your next appointment, please call your pharmacy.  For educational health videos Log in to : [email or web address] Or : [email or web address], password : [email or web address]

## 2017-12-26 LAB — LIPID PANEL
Chol/HDL Ratio: 2.7 ratio (ref 0.0–5.0)
Cholesterol, Total: 133 mg/dL (ref 100–199)
HDL: 49 mg/dL (ref >39)
LDL CALC: 72 mg/dL (ref 0–99)
Triglycerides: 60 mg/dL (ref 0–149)
VLDL CHOLESTEROL CAL: 12 mg/dL (ref 5–40)

## 2018-03-04 ENCOUNTER — Telehealth: Payer: Self-pay | Admitting: Cardiovascular Disease

## 2018-03-04 NOTE — Telephone Encounter (Signed)
If symptoms do not improve or chest pain develops, he should call 911 for transport to the ED.  Yvonne Kendall, MD [facility] Pager: [phone]

## 2018-03-04 NOTE — Telephone Encounter (Signed)
Since Monday has been a little off and having to catch his breath. He reports that he has no BP readings, no weights, and no blood sugar readings. He reports extreme thirst, lethargy, and just has not felt well this week. He did report that they recently increased his metformin. Based on the limited symptoms and assessment information recommended that he reach out to his primary care provider and that I would also send this over to Dr. Mariah Milling for his review when he returns. He verbalized understanding with no further questions at this time.

## 2018-03-04 NOTE — Telephone Encounter (Signed)
Spoke with patient and he confirmed he is headed to see his PCP at 6:20PM at [facility] practice. He denies any chest pain at this time and blood pressure was 158/95 then he laid down and rested and repeat was 140/90. Reviewed concerns by provider for him to be seen if his symptoms persist or worsen and he was very appreciative for the call back. Instructed him to please call back if he has any further concerns.

## 2018-03-04 NOTE — Telephone Encounter (Signed)
Pt states he may need to change his medications, states he is SOB Pt c/o Shortness Of Breath: STAT if SOB developed within the last 24 hours or pt is noticeably SOB on the phone  1. Are you currently SOB (can you hear that pt is SOB on the phone)?  10 min ago  2. How long have you been experiencing SOB? Since Monday 5/27  3. Are you SOB when sitting or when up moving around? Worse when he tries to do stuff, up moving around  4. Are you currently experiencing any other symptoms? Not that he is aware of.

## 2018-03-05 NOTE — Telephone Encounter (Signed)
To Dr. Mariah Milling / Elita Quick as an Lorain Childes.  Dr. Okey Dupre made aware.

## 2018-03-05 NOTE — Telephone Encounter (Signed)
Spoke with patient and he reports they did cath at [facility] and found nothing new. He did report elevated sugars of 230 and 260. He was in good spirits and was very appreciative for the call to check on him. Instructed him to please give [place] a call if he should have any further questions or concerns.

## 2018-03-05 NOTE — Telephone Encounter (Signed)
Pt calling stating he's just calling for an update  He states he did end up going to PCP and was admitted into [facility]  He is now there and states he will be going for a cath in a little bit  Just FYI

## 2018-03-31 ENCOUNTER — Telehealth: Payer: Self-pay | Admitting: Cardiovascular Disease

## 2018-03-31 NOTE — Telephone Encounter (Signed)
Cardiac clearance Patient on Brilinta and aspirin. Routing to Dr Mariah MillingGollan.

## 2018-03-31 NOTE — Telephone Encounter (Signed)
° °  [facility] Pre-operative Risk Assessment    Request for surgical clearance:  1. What type of surgery is being performed? EGD Colonoscopy    2. When is this surgery scheduled? Not noted    3. What type of clearance is required (medical clearance vs. Pharmacy clearance to hold med vs. Both)? Pharmacy   4. Are there any medications that need to be held prior to surgery and how long? Please advise   5. Practice name and name of physician performing surgery? [facility]   6. What is your office phone number [phone]   7.   What is your office fax number [phone]   8.   Anesthesia type (None, local, MAC, general) ? Unknown    Eugene Woods 03/31/2018, 4:11 PM  _________________________________________________________________   (provider comments below)

## 2018-04-01 NOTE — Telephone Encounter (Signed)
Moderate but acceptable risk to stay on asa  Stop brilinta 5 days before EGD Ideally would like until sept 2019, but if urgent EGD, could proceed

## 2018-04-01 NOTE — Telephone Encounter (Signed)
Routed to number provided via EPIC fax.  

## 2018-04-28 ENCOUNTER — Other Ambulatory Visit: Payer: Self-pay | Admitting: *Deleted

## 2018-04-28 ENCOUNTER — Telehealth: Payer: Self-pay | Admitting: Cardiovascular Disease

## 2018-04-28 MED ORDER — TICAGRELOR 90 MG PO TABS: 90.0000 mg | ORAL_TABLET | Freq: Two times a day (BID) | ORAL | 2 refills | Status: AC

## 2018-04-28 NOTE — Telephone Encounter (Signed)
°*  STAT* If patient is at the pharmacy, call can be transferred to refill team.   1. Which medications need to be refilled? (please list name of each medication and dose if known) ticagrelor (BRILINTA) 90 MG - 1 tablet 2 times daily   2. Which pharmacy/location (including street and city if local pharmacy) is medication to be sent to? [facility] in [place]   3. Do they need a 30 day or 90 day supply? 90 day

## 2018-04-28 NOTE — Telephone Encounter (Signed)
Requested Prescriptions   Signed Prescriptions Disp Refills  . ticagrelor (BRILINTA) 90 MG TABS tablet 180 tablet 2    Sig: Take 1 tablet (90 mg total) by mouth 2 (two) times daily.    Authorizing Provider: Antonieta IbaGOLLAN, TIMOTHY J    Ordering User: Kendrick FriesLOPEZ, MARINA C

## 2018-04-28 NOTE — Telephone Encounter (Signed)
Disp Refills Start End   ticagrelor (BRILINTA) 90 MG TABS tablet 180 tablet 2 04/28/2018    Sig - Route: Take 1 tablet (90 mg total) by mouth 2 (two) times daily. - Oral   Sent to pharmacy as: ticagrelor (BRILINTA) 90 MG Tab tablet   E-Prescribing Status: Receipt confirmed by pharmacy (04/28/2018 4:50 PM EDT)   [id number] [place]  - [address] [place] [address]

## 2018-12-04 ENCOUNTER — Other Ambulatory Visit: Payer: Self-pay | Admitting: Family Medicine

## 2018-12-04 DIAGNOSIS — H539 Unspecified visual disturbance: Secondary | ICD-10-CM

## 2018-12-09 ENCOUNTER — Ambulatory Visit
Admission: RE | Admit: 2018-12-09 | Discharge: 2018-12-09 | Disposition: A | Discharge status: Home / Self Care | Payer: No Typology Code available for payment source | Source: Ambulatory Visit | Attending: Family Medicine | Admitting: Family Medicine

## 2018-12-09 DIAGNOSIS — H539 Unspecified visual disturbance: Secondary | ICD-10-CM | POA: Diagnosis present

## 2019-02-16 ENCOUNTER — Telehealth: Payer: Self-pay

## 2019-02-16 NOTE — Telephone Encounter (Signed)
Spoke with patient to schedule overdue F/U with Dr. Mariah Milling. Patient states he is now under the care of a cardiologist at the [place]. Recall removed.

## 2021-03-28 ENCOUNTER — Ambulatory Visit
Admission: EM | Admit: 2021-03-28 | Discharge: 2021-03-28 | Disposition: A | Discharge status: Home / Self Care | Payer: 59 | Attending: Sports Medicine | Admitting: Sports Medicine

## 2021-03-28 ENCOUNTER — Other Ambulatory Visit: Payer: Self-pay

## 2021-03-28 ENCOUNTER — Encounter: Payer: Self-pay | Admitting: Emergency Medicine

## 2021-03-28 DIAGNOSIS — J069 Acute upper respiratory infection, unspecified: Secondary | ICD-10-CM

## 2021-03-28 DIAGNOSIS — J029 Acute pharyngitis, unspecified: Secondary | ICD-10-CM

## 2021-03-28 DIAGNOSIS — R0981 Nasal congestion: Secondary | ICD-10-CM | POA: Diagnosis present

## 2021-03-28 DIAGNOSIS — U071 COVID-19: Secondary | ICD-10-CM | POA: Insufficient documentation

## 2021-03-28 DIAGNOSIS — K5900 Constipation, unspecified: Secondary | ICD-10-CM | POA: Diagnosis present

## 2021-03-28 DIAGNOSIS — R519 Headache, unspecified: Secondary | ICD-10-CM | POA: Diagnosis present

## 2021-03-28 DIAGNOSIS — R509 Fever, unspecified: Secondary | ICD-10-CM | POA: Diagnosis not present

## 2021-03-28 DIAGNOSIS — B349 Viral infection, unspecified: Secondary | ICD-10-CM | POA: Insufficient documentation

## 2021-03-28 LAB — RESP PANEL BY RT-PCR (FLU A&B, COVID) ARPGX2
Influenza A by PCR: NEGATIVE
Influenza B by PCR: NEGATIVE
SARS Coronavirus 2 by RT PCR: POSITIVE — AB

## 2021-03-28 LAB — GROUP A STREP BY PCR: Group A Strep by PCR: NOT DETECTED

## 2021-03-28 MED ORDER — MOLNUPIRAVIR 200 MG PO CAPS
800.0000 mg | ORAL_CAPSULE | Freq: Two times a day (BID) | ORAL | 0 refills | Status: AC
Start: 2021-03-28 — End: ?

## 2021-03-28 NOTE — ED Triage Notes (Signed)
Pt presents today with c/o of nasal congestion/drainage, sore throat, cough and low grade fever over past week.  He does report that daughter tested positive last week, he then developed symptoms but did not test for Covid.

## 2021-03-28 NOTE — ED Provider Notes (Signed)
[facility]    CSN: [id number] Arrival date & time: 03/28/21  7829      History   Chief Complaint Chief Complaint  Patient presents with   Sore Throat   Cough   Nasal Congestion   Fever    HPI Eugene Woods is a 50 y.o. male.   Patient is a pleasant 50 year old male who presents for evaluation of the above issues.  His primary care physician is through the [place].  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  He said he has had COVID exposure from his child about 10 days ago.  He never tested.  He quarantined and kind of slept a lot.  Subsequently developed a little bit of neck discomfort at the base of his neck as well as some low back pain.  No radicular symptoms down his arm or his leg.  He is also had a mild headache.  Recently started develop a little bit of congestion and URI symptoms.  He also developed a low-grade fever over the past few days.  He never had a fever throughout the entire course of his illness.  He denies chest pain shortness of breath.  Complicating his situation is he has had an MI, PE, and has a history of hypertension, hyperlipidemia, diabetes, GERD, and a history of coronary artery disease.  That said, no red flag signs or symptoms elicited on history.   Past Medical History:  Diagnosis Date   Coronary artery disease    CAD s/p STEMI c/b VF arrest with PCI to proximal LAD.    Diabetes mellitus without complication (HCC)    GERD (gastroesophageal reflux disease)    Hyperlipidemia    Hypertension    MI (myocardial infarction) (HCC)    NSTEMI (non-ST elevated myocardial infarction) (HCC) 2018   Pulmonary embolism (HCC)    STEMI (ST elevation myocardial infarction) (HCC) 2017    Patient Active Problem List   Diagnosis Date Noted   CAD (coronary artery disease), native coronary artery 11/17/2017   Hyperlipidemia 11/17/2017   OSA (obstructive sleep apnea) 05/16/2016   Atherosclerosis of native coronary artery of native  heart without angina pectoris 03/22/2016   History of cardiac arrest 03/08/2016   S/P coronary artery stent placement 03/08/2016   History of pulmonary embolism 11/28/2015   Obesity 11/15/2014   Tobacco abuse 06/29/2014   Essential (primary) hypertension 05/19/2013   Hypertension goal BP (blood pressure) < 140/90 05/19/2013    Past Surgical History:  Procedure Laterality Date   CARDIAC CATHETERIZATION  06/2017   PCI to DIAG 1   CORONARY ANGIOPLASTY WITH STENT PLACEMENT  04/2016   Repeat PCI to the distal LAD    CORONARY ANGIOPLASTY WITH STENT PLACEMENT  03/2016   PCI to the Proximal/mid LAD with stent placement of a Xience Alpine drug eluting stent.         Home Medications    Prior to Admission medications   Medication Sig Start Date End Date Taking? Authorizing Provider  amLODipine (NORVASC) 10 MG tablet TAKE ONE TABLET BY MOUTH EVERY DAY FOR HEART AND BLOOD PRESSURE. 03/16/21 06/30/21 Yes [provider]  aspirin 81 MG chewable tablet Chew by mouth. 03/11/16  Yes [provider]  clopidogrel (PLAVIX) 75 MG tablet TAKE ONE TABLET BY MOUTH EVERY DAY BE SURE TO CHECK WITH YOUR DOCTOR ABOUT HOW LONG YOU SHOULD TAKE THIS MEDICINE 03/16/21 06/30/21 Yes [provider]  lisinopril (ZESTRIL) 10 MG tablet TAKE ONE-HALF TABLET BY  MOUTH EVERY DAY FOR BLOOD PRESSURE 06/29/20 06/30/21 Yes [provider]  metFORMIN (GLUCOPHAGE) 1000 MG tablet Take by mouth. 03/05/18  Yes [provider]  metoprolol succinate (TOPROL-XL) 50 MG 24 hr tablet Take 1 tablet by mouth daily. 03/05/18  Yes [provider]  Molnupiravir 200 MG CAPS Take 4 capsules (800 mg total) by mouth 2 (two) times daily. 03/28/21  Yes Delton See, MD  aspirin 81 MG tablet Take 81 mg by mouth daily.    [provider]  atorvastatin (LIPITOR) 80 MG tablet Take 80 mg by mouth daily at 6 PM.  05/08/16 12/25/17  [provider]  furosemide (LASIX) 20 MG tablet Take 20 mg  by mouth daily as needed.  05/08/16 12/25/17  [provider]  isosorbide mononitrate (IMDUR) 60 MG 24 hr tablet Take 1 tablet (60 mg total) by mouth 2 (two) times daily. 12/25/17 03/25/18  Antonieta Iba, MD  lisinopril (PRINIVIL,ZESTRIL) 20 MG tablet Take 20 mg by mouth daily.  05/02/16   [provider]  metFORMIN (GLUCOPHAGE) 1000 MG tablet Take 1,000 mg by mouth daily with breakfast.    [provider]  metoprolol succinate (TOPROL-XL) 25 MG 24 hr tablet Take 25 mg by mouth daily.  05/16/16   [provider]  nitroGLYCERIN (NITROSTAT) 0.4 MG SL tablet Place 0.4 mg under the tongue. 04/02/16 12/25/17  [provider]  ranolazine (RANEXA) 1000 MG SR tablet Take 1 tablet (1,000 mg total) by mouth 2 (two) times daily. 12/25/17   Antonieta Iba, MD  ticagrelor (BRILINTA) 90 MG TABS tablet Take 1 tablet (90 mg total) by mouth 2 (two) times daily. 04/28/18   Antonieta Iba, MD    Family History Family History  Problem Relation Age of Onset   Hypertension Mother    Hyperlipidemia Mother    Heart disease Mother    Valvular heart disease Mother    Hypertension Father    Hyperlipidemia Father    Heart attack Father 17   Heart disease Father        CABG    Diabetes Father    Heart disease Brother    Hypertension Brother    Hyperlipidemia Brother    HIV Brother     Social History Social History   Tobacco Use   Smoking status: Former    Packs/day: 0.50    Years: 27.00    Pack years: 13.50    Types: Cigarettes    Quit date: 03/08/2016    Years since quitting: 5.0   Smokeless tobacco: Former  Building services engineer Use: Never used  Substance Use Topics   Alcohol use: Yes    Alcohol/week: 2.0 standard drinks    Types: 1 Glasses of wine, 1 Standard drinks or equivalent per week   Drug use: Never     Allergies   Patient has no known allergies.   Review of Systems Review of Systems  Constitutional:  Positive for chills and fever.  Negative for activity change, appetite change, diaphoresis and fatigue.  HENT:  Positive for congestion and postnasal drip. Negative for ear pain, rhinorrhea, sinus pressure, sinus pain, sneezing and sore throat.   Eyes:  Negative for pain.  Respiratory:  Positive for cough. Negative for chest tightness, shortness of breath and wheezing.   Cardiovascular:  Negative for chest pain and palpitations.  Gastrointestinal:  Positive for constipation. Negative for abdominal pain, diarrhea, nausea and vomiting.  Genitourinary:  Negative for dysuria, flank pain, frequency, hematuria  and urgency.  Musculoskeletal:  Positive for back pain and neck pain. Negative for myalgias and neck stiffness.  Skin:  Negative for color change, pallor, rash and wound.  Neurological:  Positive for headaches. Negative for dizziness, light-headedness and numbness.  All other systems reviewed and are negative.   Physical Exam Triage Vital Signs ED Triage Vitals  Enc Vitals Group     BP 03/28/21 0935 135/66     Pulse Rate 03/28/21 0935 (!) 56     Resp 03/28/21 0935 18     Temp 03/28/21 0935 99.1 F (37.3 C)     Temp Source 03/28/21 0935 Oral     SpO2 03/28/21 0935 96 %     Weight --      Height --      Head Circumference --      Peak Flow --      Pain Score 03/28/21 0931 4     Pain Loc --      Pain Edu? --      Excl. in GC? --    No data found.  Updated Vital Signs BP 135/66 (BP Location: Left Arm)   Pulse (!) 56   Temp 99.1 F (37.3 C) (Oral)   Resp 18   SpO2 96%   Visual Acuity Right Eye Distance:   Left Eye Distance:   Bilateral Distance:    Right Eye Near:   Left Eye Near:    Bilateral Near:     Physical Exam Vitals and nursing note reviewed.  Constitutional:      General: He is not in acute distress.    Appearance: Normal appearance. He is well-developed. He is not ill-appearing, toxic-appearing or diaphoretic.  HENT:     Head: Normocephalic and atraumatic.     Right Ear: Tympanic  membrane normal.     Left Ear: Tympanic membrane normal.     Nose: Congestion present. No rhinorrhea.     Mouth/Throat:     Mouth: Mucous membranes are moist.     Pharynx: Posterior oropharyngeal erythema present. No pharyngeal swelling, oropharyngeal exudate or uvula swelling.     Tonsils: No tonsillar exudate or tonsillar abscesses. 0 on the right. 0 on the left.  Eyes:     General: No scleral icterus.       Right eye: No discharge.        Left eye: No discharge.     Extraocular Movements:     Right eye: Normal extraocular motion.     Left eye: Normal extraocular motion.     Conjunctiva/sclera: Conjunctivae normal.     Pupils: Pupils are equal, round, and reactive to light.  Neck:     Thyroid: No thyromegaly.  Cardiovascular:     Rate and Rhythm: Normal rate and regular rhythm.     Pulses: Normal pulses.     Heart sounds: Normal heart sounds. No murmur heard.   No friction rub. No gallop.  Pulmonary:     Effort: Pulmonary effort is normal.     Breath sounds: Normal breath sounds. No stridor. No wheezing, rhonchi or rales.  Musculoskeletal:     Cervical back: Normal range of motion and neck supple.  Lymphadenopathy:     Cervical: Cervical adenopathy present.  Skin:    General: Skin is warm and dry.     Capillary Refill: Capillary refill takes less than 2 seconds.     Findings: No erythema.  Neurological:     General: No focal deficit present.  Mental Status: He is alert and oriented to person, place, and time.  Psychiatric:        Mood and Affect: Mood normal.     UC Treatments / Results  Labs (all labs ordered are listed, but only abnormal results are displayed) Labs Reviewed  RESP PANEL BY RT-PCR (FLU A&B, COVID) ARPGX2 - Abnormal; Notable for the following components:      Result Value   SARS Coronavirus 2 by RT PCR POSITIVE (*)    All other components within normal limits  GROUP A STREP BY PCR    EKG   Radiology No results  found.  Procedures Procedures (including critical care time)  Medications Ordered in UC Medications - No data to display  Initial Impression / Assessment and Plan / UC Course  I have reviewed the triage vital signs and the nursing notes.  Pertinent labs & imaging results that were available during my care of the patient were reviewed by me and considered in my medical decision making (see chart for details).  Clinical impression: 1.  10 days of URI symptoms with fever that began 1 to 2 days ago.  Patient has had exposure to COVID-19.  Has not been tested.  Has a persistent headache mostly posteriorly at the base of his neck and some back pain.  He is also reported a little bit of constipation.  He has nasal congestion, sore throat, low-grade fever and cough. 2.  History of prior MI. 3.  History of prior PE. 4.  History of hypertension, hyperlipidemia, type 2 diabetes, coronary artery disease, and GERD.  Treatment plan: 1.  The findings and treatment plan were discussed in detail with the patient.  Patient was in agreement. 2.  Recommended getting a strep test.  It was negative. 3.  Recommended getting a respiratory panel.  It was negative for influenza a and B.  It was positive for COVID. 4.  Given his comorbidities and his symptoms I am going to go ahead and treat him with a oral COVID medication.  It was sent to his pharmacy. 5.  Educational handouts provided. 6.  Tylenol or Motrin for any fever or discomfort. 7.  Regarding his constipation I recommended increasing his fruits and fibers and just using over-the-counter meds and trying to do a little bit of walking around his community. 8.  If symptoms persist he should see his primary care provider. 9.  If symptoms worsen that he should go to the ER. 10.  He did not need a work note but I advised him not to go back to work until he was asymptomatic.  Clearly he has been exposed more than 5 days ago and per the CDC guidelines he is  already quarantine. 11.  He was discharged in stable condition and will follow-up here as needed.  Greater than 45 minutes was spent with the patient in the office obtaining a history, doing a review of systems, doing a thorough physical exam, reviewing his chart in detail, discussing all of his symptoms, discussing all of his comorbidities, ordering the necessary tests and interpreting them, coming up with a treatment plan, educating the patient on this plan, prescribing medications, and discussing alternative treatments and side effects to the current treatment plan.  All questions were encouraged and answered.  Patient was discharged in stable condition.     Final Clinical Impressions(s) / UC Diagnoses   Final diagnoses:  COVID-19  Headache due to viral infection  Febrile illness  Acute pharyngitis, unspecified  etiology  Nasal congestion  Viral URI with cough  Constipation, unspecified constipation type     Discharge Instructions      As we discussed, I am going to go ahead and treat you with one of the newer oral COVID medications.  There is some side effects so just please be aware of that.  However, given your medical history you would not be a good candidate to take this medication to decrease the symptoms and keep you out of the hospital. Please see educational handouts. Over-the-counter meds such as Tylenol or Motrin for any fever or discomfort. Over-the-counter cough medicine for any cough. If your symptoms persist please see your primary care provider. If they worsen in any way please go to the emergency room.     ED Prescriptions     Medication Sig Dispense Auth. Provider   Molnupiravir 200 MG CAPS Take 4 capsules (800 mg total) by mouth 2 (two) times daily. 40 capsule Delton See, MD      PDMP not reviewed this encounter.   Delton See, MD 03/28/21 1230

## 2021-03-28 NOTE — Discharge Instructions (Addendum)
As we discussed, I am going to go ahead and treat you with one of the newer oral COVID medications.  There is some side effects so just please be aware of that.  However, given your medical history you would not be a good candidate to take this medication to decrease the symptoms and keep you out of the hospital. Please see educational handouts. Over-the-counter meds such as Tylenol or Motrin for any fever or discomfort. Over-the-counter cough medicine for any cough. If your symptoms persist please see your primary care provider. If they worsen in any way please go to the emergency room.

## 2024-07-27 ENCOUNTER — Ambulatory Visit: Admission: EM | Admit: 2024-07-27 | Discharge: 2024-07-27 | Disposition: A | Discharge status: Home / Self Care

## 2024-07-27 ENCOUNTER — Encounter: Payer: Self-pay | Admitting: Emergency Medicine

## 2024-07-27 DIAGNOSIS — S31109A Unspecified open wound of abdominal wall, unspecified quadrant without penetration into peritoneal cavity, initial encounter: Secondary | ICD-10-CM

## 2024-07-27 DIAGNOSIS — H6502 Acute serous otitis media, left ear: Secondary | ICD-10-CM

## 2024-07-27 MED ORDER — CEFDINIR 300 MG PO CAPS: 300.0000 mg | ORAL_CAPSULE | Freq: Two times a day (BID) | ORAL | 0 refills | Status: AC

## 2024-07-27 NOTE — ED Provider Notes (Signed)
 Eugene Woods    CSN: [id number] Arrival date & time: 07/27/24  1134      History   Chief Complaint Chief Complaint  Patient presents with   Otalgia    HPI Eugene Woods is a 53 y.o. male.   Patient presents for evaluation of left-sided ear pain, decreased hearing and lymph node swelling on the left side beginning 1 day ago.  Also having concerns regarding a wound to the right side of the abdomen that has not healed within 3 weeks.  Has noticed slight drainage to the wound but has been cleansing and keeping covered with a bandage.  Wound has become painful.  Unsure of how it occurred.    Past Medical History:  Diagnosis Date   Coronary artery disease    CAD s/p STEMI c/b VF arrest with PCI to proximal LAD.    Diabetes mellitus without complication (HCC)    GERD (gastroesophageal reflux disease)    Hyperlipidemia    Hypertension    MI (myocardial infarction) (HCC)    NSTEMI (non-ST elevated myocardial infarction) (HCC) 2018   Pulmonary embolism (HCC)    STEMI (ST elevation myocardial infarction) (HCC) 2017    Patient Active Problem List   Diagnosis Date Noted   CAD (coronary artery disease), native coronary artery 11/17/2017   Hyperlipidemia 11/17/2017   OSA (obstructive sleep apnea) 05/16/2016   Atherosclerosis of native coronary artery of native heart without angina pectoris 03/22/2016   History of cardiac arrest 03/08/2016   S/P coronary artery stent placement 03/08/2016   History of pulmonary embolism 11/28/2015   Obesity 11/15/2014   Tobacco abuse 06/29/2014   Essential (primary) hypertension 05/19/2013   Hypertension goal BP (blood pressure) < 140/90 05/19/2013    Past Surgical History:  Procedure Laterality Date   CARDIAC CATHETERIZATION  06/2017   PCI to DIAG 1   CORONARY ANGIOPLASTY WITH STENT PLACEMENT  04/2016   Repeat PCI to the distal LAD    CORONARY ANGIOPLASTY WITH STENT PLACEMENT  03/2016   PCI to the Proximal/mid LAD with stent  placement of a Xience Alpine drug eluting stent.         Home Medications    Prior to Admission medications   Medication Sig Start Date End Date Taking? Authorizing Provider  cefdinir (OMNICEF) 300 MG capsule Take 1 capsule (300 mg total) by mouth 2 (two) times daily for 10 days. 07/27/24 08/06/24 Yes Glorya Bartley R, NP  chlorthalidone (HYGROTON) 25 MG tablet Take 25 mg by mouth daily. 06/03/23  Yes [provider]  clopidogrel (PLAVIX) 75 MG tablet Take 75 mg by mouth daily.   Yes [provider]  rosuvastatin (CRESTOR) 5 MG tablet Take 5 mg by mouth daily.   Yes [provider]  amLODipine (NORVASC) 10 MG tablet TAKE ONE TABLET BY MOUTH EVERY DAY FOR HEART AND BLOOD PRESSURE. 03/16/21 06/30/21  [provider]  aspirin 81 MG chewable tablet Chew by mouth. 03/11/16   [provider]  aspirin 81 MG tablet Take 81 mg by mouth daily.    [provider]  atorvastatin (LIPITOR) 80 MG tablet Take 80 mg by mouth daily at 6 PM.  05/08/16 12/25/17  [provider]  furosemide (LASIX) 20 MG tablet Take 20 mg by mouth daily as needed.  05/08/16 12/25/17  [provider]  isosorbide  mononitrate (IMDUR ) 60 MG 24 hr tablet Take 1 tablet (60 mg total) by mouth 2 (two) times daily. 12/25/17 03/25/18  Gollan, Timothy J,  MD  lisinopril (PRINIVIL,ZESTRIL) 20 MG tablet Take 20 mg by mouth daily.  05/02/16   [provider]  lisinopril (ZESTRIL) 10 MG tablet TAKE ONE-HALF TABLET BY MOUTH EVERY DAY FOR BLOOD PRESSURE 06/29/20 06/30/21  [provider]  metFORMIN (GLUCOPHAGE) 1000 MG tablet Take 1,000 mg by mouth daily with breakfast.    [provider]  metFORMIN (GLUCOPHAGE) 1000 MG tablet Take by mouth. 03/05/18   [provider]  metoprolol succinate (TOPROL-XL) 25 MG 24 hr tablet Take 25 mg by mouth daily.  05/16/16   [provider]  metoprolol succinate (TOPROL-XL) 50 MG 24 hr tablet Take 1 tablet by mouth  daily. 03/05/18   [provider]  Molnupiravir  200 MG CAPS Take 4 capsules (800 mg total) by mouth 2 (two) times daily. 03/28/21   Gwenn Kent, MD  nitroGLYCERIN (NITROSTAT) 0.4 MG SL tablet Place 0.4 mg under the tongue. 04/02/16 12/25/17  [provider]  ranolazine  (RANEXA ) 1000 MG SR tablet Take 1 tablet (1,000 mg total) by mouth 2 (two) times daily. 12/25/17   Gollan, Timothy J, MD  ticagrelor  (BRILINTA ) 90 MG TABS tablet Take 1 tablet (90 mg total) by mouth 2 (two) times daily. 04/28/18   Perla Evalene PARAS, MD    Family History Family History  Problem Relation Age of Onset   Hypertension Mother    Hyperlipidemia Mother    Heart disease Mother    Valvular heart disease Mother    Hypertension Father    Hyperlipidemia Father    Heart attack Father 12   Heart disease Father        CABG    Diabetes Father    Heart disease Brother    Hypertension Brother    Hyperlipidemia Brother    HIV Brother     Social History Social History   Tobacco Use   Smoking status: Former    Current packs/day: 0.00    Average packs/day: 0.5 packs/day for 27.0 years (13.5 ttl pk-yrs)    Types: Cigarettes    Start date: 03/08/1989    Quit date: 03/08/2016    Years since quitting: 8.3   Smokeless tobacco: Former  Building services engineer status: Never Used  Substance Use Topics   Alcohol use: Yes    Alcohol/week: 2.0 standard drinks of alcohol    Types: 1 Glasses of wine, 1 Standard drinks or equivalent per week   Drug use: Never     Allergies   Patient has no known allergies.   Review of Systems Review of Systems   Physical Exam Triage Vital Signs ED Triage Vitals  Encounter Vitals Group     BP 07/27/24 1153 (!) 147/91     Girls Systolic BP Percentile --      Girls Diastolic BP Percentile --      Boys Systolic BP Percentile --      Boys Diastolic BP Percentile --      Pulse Rate 07/27/24 1153 83     Resp 07/27/24 1153 20     Temp 07/27/24 1153 98.4 F (36.9 C)      Temp Source 07/27/24 1153 Oral     SpO2 07/27/24 1153 97 %     Weight --      Height --      Head Circumference --      Peak Flow --      Pain Score 07/27/24 1144 4     Pain Loc --      Pain  Education --      Exclude from [employer] Chart --    No data found.  Updated Vital Signs BP (!) 147/91 (BP Location: Left Arm)   Pulse 83   Temp 98.4 F (36.9 C) (Oral)   Resp 20   SpO2 97%   Visual Acuity Right Eye Distance:   Left Eye Distance:   Bilateral Distance:    Right Eye Near:   Left Eye Near:    Bilateral Near:     Physical Exam Constitutional:      Appearance: Normal appearance.  HENT:     Right Ear: Hearing, tympanic membrane, ear canal and external ear normal.     Left Ear: Tympanic membrane is erythematous.  Eyes:     Extraocular Movements: Extraocular movements intact.  Pulmonary:     Effort: Pulmonary effort is normal.  Skin:    Comments: 1 x 2 cm wound present to the right lower abdomen along the waistline, yellow granulation tissue within the wound bed, nontender, nondraining  Neurological:     Mental Status: He is alert and oriented to person, place, and time.      UC Treatments / Results  Labs (all labs ordered are listed, but only abnormal results are displayed) Labs Reviewed - No data to display  EKG   Radiology No results found.  Procedures Procedures (including critical care time)  Medications Ordered in UC Medications - No data to display  Initial Impression / Assessment and Plan / UC Course  I have reviewed the triage vital signs and the nursing notes.  Pertinent labs & imaging results that were available during my care of the patient were reviewed by me and considered in my medical decision making (see chart for details).  Nonrecurrent acute serous otitis media of the left ear, wound of the abdomen  Erythema to the tympanic membrane is consistent with infection, advised against ear cleaning, may use over-the-counter analgesics and  warm compresses to the external ear for comfort, may follow-up if symptoms persist worsen or recur   Open wound to the right lower abdomen, no overt signs of infection however having delayed healing as that occurred 3 weeks ago therefore placed on antibiotics for coverage for bacteria, recommended daily wound cleansing and to monitor  Prescribed cefdinir daily attempt to treat both areas,\advised follow-up for any further delays in healing Final Clinical Impressions(s) / UC Diagnoses   Final diagnoses:  Non-recurrent acute serous otitis media of left ear  Wound of abdomen     Discharge Instructions      Today you are being treated for an infection of the eardrum and delayed healing of the wound to your abdomen  Take cefdinir twice daily for 10 days, you should begin to see improvement after 48 hours of medication use and then it should progressively get better  Wean over the room with unscented water and soap at least once daily, easiest to do during her normal hygiene, cover with a nonadherent dressing to keep area from rubbing against her waistband  You may use Tylenol or ibuprofen for management of discomfort  May hold warm compresses to the ear for additional comfort  Please not attempted any ear cleaning or object or fluid placement into the ear canal to prevent further irritation  If symptoms do not improve for either area please follow-up for reevaluation    ED Prescriptions     Medication Sig Dispense Auth. Provider   cefdinir (OMNICEF) 300 MG capsule Take 1 capsule (300 mg  total) by mouth 2 (two) times daily for 10 days. 20 capsule Shay Jhaveri R, NP      PDMP not reviewed this encounter.   Teresa Shelba SAUNDERS, NP 07/27/24 1226

## 2024-07-27 NOTE — Discharge Instructions (Signed)
 Today you are being treated for an infection of the eardrum and delayed healing of the wound to your abdomen  Take cefdinir twice daily for 10 days, you should begin to see improvement after 48 hours of medication use and then it should progressively get better  Wean over the room with unscented water and soap at least once daily, easiest to do during her normal hygiene, cover with a nonadherent dressing to keep area from rubbing against her waistband  You may use Tylenol or ibuprofen for management of discomfort  May hold warm compresses to the ear for additional comfort  Please not attempted any ear cleaning or object or fluid placement into the ear canal to prevent further irritation  If symptoms do not improve for either area please follow-up for reevaluation

## 2024-07-27 NOTE — ED Triage Notes (Signed)
 Patient complains of left ear pain that started yesterday. Patient has not taken anything for symptoms. Rates ear pain 4/10.

## 2024-07-31 ENCOUNTER — Telehealth: Payer: Self-pay | Admitting: Emergency Medicine

## 2024-07-31 MED ORDER — AMOXICILLIN-POT CLAVULANATE 875-125 MG PO TABS: 1.0000 | ORAL_TABLET | Freq: Two times a day (BID) | ORAL | 0 refills | Status: AC

## 2024-07-31 NOTE — Telephone Encounter (Signed)
 Patient notified in clinic today ear pain is progressively worsening despite use of antibiotics however the wound on the stomach is improving therefore adding Augmentin additionally and discussed that if no improvement with ear discomfort with use of new antibiotic he is to be reseen in person, verbalized understand
